# Patient Record
Sex: Female | Born: 1995 | Marital: Single | Smoking: Never smoker
Health system: Southern US, Community
[De-identification: ages and names within clinical notes are randomized; demographics above are authoritative.]

---
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
## 2014-05-26 ENCOUNTER — Emergency Department (INDEPENDENT_AMBULATORY_CARE_PROVIDER_SITE_OTHER)
Admission: EM | Admit: 2014-05-26 | Discharge: 2014-05-26 | Disposition: A | Discharge status: Home / Self Care | Payer: Medicaid Other | Source: Home / Self Care

## 2014-05-26 ENCOUNTER — Encounter (HOSPITAL_COMMUNITY): Payer: Self-pay | Admitting: Family Medicine

## 2014-05-26 DIAGNOSIS — A084 Viral intestinal infection, unspecified: Secondary | ICD-10-CM

## 2014-05-26 MED ORDER — ONDANSETRON HCL 4 MG PO TABS: 4.0000 mg | ORAL_TABLET | Freq: Three times a day (TID) | ORAL | Status: DC | PRN

## 2014-05-26 NOTE — ED Notes (Signed)
Reports two days of vomiting and diarrhea.  No fever.   States vomiting started yesterday evening and into this morning.  No able to keep down water or food.  Symptoms gradually getting worse.  No otc treatments tried.

## 2014-05-26 NOTE — ED Provider Notes (Signed)
CSN: [id number]     Arrival date & time 05/26/14  [id number] History   None    Chief Complaint  Patient presents with  . Emesis  . Diarrhea   (Consider location/radiation/quality/duration/timing/severity/associated sxs/prior Treatment) HPI  Vomiting and diarrhea. Started 2 days ago. Non-bloody non-bilious emesis. 4 bouts of watery diarrhea. Mint tea w/o benefit. Comes and goes. Associated w/ nausea and itchy eyes. No sick contacts. Denies fevers, dysuria, back pain, vaginal discharge, chest pain, shortness of breath, neck stiffness, headache, hematochezia, hematemesis   LMP 05/14/14. Not sexually active.  Herbalife wt loss pills. Started them 21 days ago.   History reviewed. No pertinent past medical history. History reviewed. No pertinent past surgical history. Family History  Problem Relation Age of Onset  . Cancer Neg Hx   . Diabetes Neg Hx   . Heart failure Neg Hx   . Hyperlipidemia Neg Hx   . Hypertension Neg Hx    History  Substance Use Topics  . Smoking status: Never Smoker   . Smokeless tobacco: Not on file  . Alcohol Use: No   OB History    No data available     Review of Systems Per HPI with all other pertinent systems negative.   Allergies  Review of patient's allergies indicates no known allergies.  Home Medications   Prior to Admission medications   Medication Sig Start Date End Date Taking? Authorizing Provider  ondansetron (ZOFRAN) 4 MG tablet Take 1 tablet (4 mg total) by mouth every 8 (eight) hours as needed for nausea or vomiting. 05/26/14   Ozella Rocksavid J Merrell, MD   BP 109/75 mmHg  Pulse 72  Temp(Src) 98.8 F (37.1 C) (Oral)  Resp 14  SpO2 98%  LMP 05/14/2014 Physical Exam Physical Exam  Constitutional: oriented to person, place, and time. appears well-developed and well-nourished. No distress.  HENT:  Head: Normocephalic and atraumatic.  Eyes: EOMI. PERRL.  Neck: Normal range of motion.  Cardiovascular: RRR, no m/r/g, 2+ distal pulses,   Pulmonary/Chest: Effort normal and breath sounds normal. No respiratory distress.  Abdominal: Soft. Bowel sounds are normal. NonTTP, no distension.  Musculoskeletal: Normal range of motion. Non ttp, no effusion.  Neurological: alert and oriented to person, place, and time.  Skin: Skin is warm. No rash noted. non diaphoretic.  Psychiatric: normal mood and affect. behavior is normal. Judgment and thought content normal.   ED Course  Procedures (including critical care time) Labs Review Labs Reviewed - No data to display  Imaging Review No results found.   MDM   1. Viral gastroenteritis    Zofran, Imodium, probiotic or live active culture yogurt, fluids, rest, work note provided, precautions given and all questions answered      Ozella Rocksavid J Merrell, MD 05/26/14 [id number]

## 2014-05-26 NOTE — Discharge Instructions (Signed)
Have a viral gut infection called gastroenteritis. Please start using the Zofran for nausea and upset stomach. Please use a daily probiotic or live active culture yogurt such as activia to help replenish the good bacteria in your stomach. Consider using Imodium if your diarrhea becomes severe. This should resolve in another 1-3 days but may last as long as 2 weeks.    Gastritis, Adult Gastritis is soreness and swelling (inflammation) of the lining of the stomach. Gastritis can develop as a sudden onset (acute) or long-term (chronic) condition. If gastritis is not treated, it can lead to stomach bleeding and ulcers. CAUSES  Gastritis occurs when the stomach lining is weak or damaged. Digestive juices from the stomach then inflame the weakened stomach lining. The stomach lining may be weak or damaged due to viral or bacterial infections. One common bacterial infection is the Helicobacter pylori infection. Gastritis can also result from excessive alcohol consumption, taking certain medicines, or having too much acid in the stomach.  SYMPTOMS  In some cases, there are no symptoms. When symptoms are present, they may include:  Pain or a burning sensation in the upper abdomen.  Nausea.  Vomiting.  An uncomfortable feeling of fullness after eating. DIAGNOSIS  Your caregiver may suspect you have gastritis based on your symptoms and a physical exam. To determine the cause of your gastritis, your caregiver may perform the following:  Blood or stool tests to check for the H pylori bacterium.  Gastroscopy. A thin, flexible tube (endoscope) is passed down the esophagus and into the stomach. The endoscope has a light and camera on the end. Your caregiver uses the endoscope to view the inside of the stomach.  Taking a tissue sample (biopsy) from the stomach to examine under a microscope. TREATMENT  Depending on the cause of your gastritis, medicines may be prescribed. If you have a bacterial infection,  such as an H pylori infection, antibiotics may be given. If your gastritis is caused by too much acid in the stomach, H2 blockers or antacids may be given. Your caregiver may recommend that you stop taking aspirin, ibuprofen, or other nonsteroidal anti-inflammatory drugs (NSAIDs). HOME CARE INSTRUCTIONS  Only take over-the-counter or prescription medicines as directed by your caregiver.  If you were given antibiotic medicines, take them as directed. Finish them even if you start to feel better.  Drink enough fluids to keep your urine clear or pale yellow.  Avoid foods and drinks that make your symptoms worse, such as:  Caffeine or alcoholic drinks.  Chocolate.  Peppermint or mint flavorings.  Garlic and onions.  Spicy foods.  Citrus fruits, such as oranges, lemons, or limes.  Tomato-based foods such as sauce, chili, salsa, and pizza.  Fried and fatty foods.  Eat small, frequent meals instead of large meals. SEEK IMMEDIATE MEDICAL CARE IF:   You have black or dark red stools.  You vomit blood or material that looks like coffee grounds.  You are unable to keep fluids down.  Your abdominal pain gets worse.  You have a fever.  You do not feel better after 1 week.  You have any other questions or concerns. MAKE SURE YOU:  Understand these instructions.  Will watch your condition.  Will get help right away if you are not doing well or get worse. Document Released: 12/18/2000 Document Revised: 06/25/2011 Document Reviewed: 02/06/2011 [facility] Patient Information 2015 [place]. This information is not intended to replace advice given to you by your health care provider. Make sure you  discuss any questions you have with your health care provider. ° °

## 2014-05-28 ENCOUNTER — Emergency Department (HOSPITAL_COMMUNITY)
Admission: EM | Admit: 2014-05-28 | Discharge: 2014-05-28 | Disposition: A | Discharge status: Home / Self Care | Payer: Medicaid Other | Attending: Emergency Medicine | Admitting: Emergency Medicine

## 2014-05-28 ENCOUNTER — Encounter (HOSPITAL_COMMUNITY): Payer: Self-pay | Admitting: Emergency Medicine

## 2014-05-28 DIAGNOSIS — R1110 Vomiting, unspecified: Secondary | ICD-10-CM | POA: Diagnosis present

## 2014-05-28 DIAGNOSIS — K529 Noninfective gastroenteritis and colitis, unspecified: Secondary | ICD-10-CM | POA: Insufficient documentation

## 2014-05-28 DIAGNOSIS — E860 Dehydration: Secondary | ICD-10-CM | POA: Diagnosis not present

## 2014-05-28 LAB — I-STAT CHEM 8, ED
BUN: 10 mg/dL (ref 6–20)
CHLORIDE: 104 mmol/L (ref 101–111)
Calcium, Ion: 1.22 mmol/L (ref 1.12–1.23)
Creatinine, Ser: 0.6 mg/dL (ref 0.44–1.00)
Glucose, Bld: 112 mg/dL — ABNORMAL HIGH (ref 65–99)
HEMATOCRIT: 43 % (ref 36.0–46.0)
Hemoglobin: 14.6 g/dL (ref 12.0–15.0)
POTASSIUM: 4 mmol/L (ref 3.5–5.1)
SODIUM: 141 mmol/L (ref 135–145)
TCO2: 24 mmol/L (ref 0–100)

## 2014-05-28 MED ORDER — ONDANSETRON HCL 4 MG/2ML IJ SOLN
4.0000 mg | Freq: Once | INTRAMUSCULAR | Status: AC
  Administered 2014-05-28: 4 mg via INTRAVENOUS
  Filled 2014-05-28: qty 2

## 2014-05-28 MED ORDER — SODIUM CHLORIDE 0.9 % IV BOLUS (SEPSIS)
1000.0000 mL | Freq: Once | INTRAVENOUS | Status: AC
  Administered 2014-05-28: 1000 mL via INTRAVENOUS

## 2014-05-28 NOTE — ED Notes (Addendum)
Pt reports continue to have vomiting and diarrhea x 5 days. Pt seen at urgent care for same. Last vomiting was yesterday and diarrhea 2 days ago.

## 2014-05-28 NOTE — ED Provider Notes (Signed)
CSN: [id number]     Arrival date & time 05/28/14  1003 History   First MD Initiated Contact with Patient 05/28/14 1013     Chief Complaint  Patient presents with  . Emesis  . Diarrhea      HPI Patient presents with gastroenteritis symptoms for the last 4-5 days.  Seen in urgent care 2 days ago and placed on Zofran.  Has had some continued vomiting but last time she vomited was yesterday.  Last diarrhea was 2 days ago.  Reason she came in today because she got dizzy.  Patient is not sexually active.  She has a previous medical history of anemia History reviewed. No pertinent past medical history. History reviewed. No pertinent past surgical history. Family History  Problem Relation Age of Onset  . Cancer Neg Hx   . Diabetes Neg Hx   . Heart failure Neg Hx   . Hyperlipidemia Neg Hx   . Hypertension Neg Hx    History  Substance Use Topics  . Smoking status: Never Smoker   . Smokeless tobacco: Not on file  . Alcohol Use: No   OB History    No data available     Review of Systems  All other systems reviewed and are negative  Allergies  Review of patient's allergies indicates no known allergies.  Home Medications   Prior to Admission medications   Medication Sig Start Date End Date Taking? Authorizing Provider  acetaminophen (TYLENOL) 500 MG tablet Take 500 mg by mouth every 6 (six) hours as needed for mild pain or moderate pain.   Yes Historical Provider, MD  ondansetron (ZOFRAN) 4 MG tablet Take 1 tablet (4 mg total) by mouth every 8 (eight) hours as needed for nausea or vomiting. 05/26/14  Yes Ozella Rocksavid J Merrell, MD   BP 102/54 mmHg  Pulse 81  Temp(Src) 98.4 F (36.9 C) (Oral)  Resp 16  Ht 5' (1.524 m)  Wt 169 lb 9 oz (76.913 kg)  BMI 33.12 kg/m2  SpO2 100%  LMP 05/14/2014 Physical Exam Physical Exam  Nursing note and vitals reviewed. Constitutional: She is oriented to person, place, and time. She appears well-developed and well-nourished. No distress.  HENT:   Head: Normocephalic and atraumatic.  Eyes: Pupils are equal, round, and reactive to light.  Neck: Normal range of motion.  Cardiovascular: Normal rate and intact distal pulses.   Pulmonary/Chest: No respiratory distress.  Abdominal: Normal appearance. She exhibits no distension.  Musculoskeletal: Normal range of motion.  Neurological: She is alert and oriented to person, place, and time. No cranial nerve deficit.  Skin: Skin is warm and dry. No rash noted.  Psychiatric: She has a normal mood and affect. Her behavior is normal.   ED Course  Procedures (including critical care time) Medications  sodium chloride 0.9 % bolus 1,000 mL (0 mLs Intravenous Stopped 05/28/14 1147)  ondansetron (ZOFRAN) injection 4 mg (4 mg Intravenous Given 05/28/14 1029)    Labs Review Labs Reviewed  I-STAT CHEM 8, ED - Abnormal; Notable for the following:    Glucose, Bld 112 (*)    All other components within normal limits   After treatment in the ED the patient feels back to baseline and wants to go home.   MDM   Final diagnoses:  Gastroenteritis  Dehydration        Nelva Nayobert Livy Ross, MD 05/28/14 1204

## 2014-05-28 NOTE — Discharge Instructions (Signed)
Dehydration, Adult Dehydration is when you lose more fluids from the body than you take in. Vital organs like the kidneys, brain, and heart cannot function without a proper amount of fluids and salt. Any loss of fluids from the body can cause dehydration.  CAUSES   Vomiting.  Diarrhea.  Excessive sweating.  Excessive urine output.  Fever. SYMPTOMS  Mild dehydration  Thirst.  Dry lips.  Slightly dry mouth. Moderate dehydration  Very dry mouth.  Sunken eyes.  Skin does not bounce back quickly when lightly pinched and released.  Dark urine and decreased urine production.  Decreased tear production.  Headache. Severe dehydration  Very dry mouth.  Extreme thirst.  Rapid, weak pulse (more than 100 beats per minute at rest).  Cold hands and feet.  Not able to sweat in spite of heat and temperature.  Rapid breathing.  Blue lips.  Confusion and lethargy.  Difficulty being awakened.  Minimal urine production.  No tears. DIAGNOSIS  Your caregiver will diagnose dehydration based on your symptoms and your exam. Blood and urine tests will help confirm the diagnosis. The diagnostic evaluation should also identify the cause of dehydration. TREATMENT  Treatment of mild or moderate dehydration can often be done at home by increasing the amount of fluids that you drink. It is best to drink small amounts of fluid more often. Drinking too much at one time can make vomiting worse. Refer to the home care instructions below. Severe dehydration needs to be treated at the hospital where you will probably be given intravenous (IV) fluids that contain water and electrolytes. HOME CARE INSTRUCTIONS   Ask your caregiver about specific rehydration instructions.  Drink enough fluids to keep your urine clear or pale yellow.  Drink small amounts frequently if you have nausea and vomiting.  Eat as you normally do.  Avoid:  Foods or drinks high in sugar.  Carbonated  drinks.  Juice.  Extremely hot or cold fluids.  Drinks with caffeine.  Fatty, greasy foods.  Alcohol.  Tobacco.  Overeating.  Gelatin desserts.  Wash your hands well to avoid spreading bacteria and viruses.  Only take over-the-counter or prescription medicines for pain, discomfort, or fever as directed by your caregiver.  Ask your caregiver if you should continue all prescribed and over-the-counter medicines.  Keep all follow-up appointments with your caregiver. SEEK MEDICAL CARE IF:  You have abdominal pain and it increases or stays in one area (localizes).  You have a rash, stiff neck, or severe headache.  You are irritable, sleepy, or difficult to awaken.  You are weak, dizzy, or extremely thirsty. SEEK IMMEDIATE MEDICAL CARE IF:   You are unable to keep fluids down or you get worse despite treatment.  You have frequent episodes of vomiting or diarrhea.  You have blood or green matter (bile) in your vomit.  You have blood in your stool or your stool looks black and tarry.  You have not urinated in 6 to 8 hours, or you have only urinated a small amount of very dark urine.  You have a fever.  You faint. MAKE SURE YOU:   Understand these instructions.  Will watch your condition.  Will get help right away if you are not doing well or get worse. Document Released: 12/24/2004 Document Revised: 03/18/2011 Document Reviewed: 08/13/2010 [employer] Patient Information 2015 [employer]. This information is not intended to replace advice given to you by your health care provider. Make sure you discuss any questions you have with your health care   provider.  

## 2014-06-14 ENCOUNTER — Ambulatory Visit
Admission: RE | Admit: 2014-06-14 | Discharge: 2014-06-14 | Disposition: A | Discharge status: Home / Self Care | Payer: Medicaid Other | Source: Ambulatory Visit | Attending: Infectious Disease | Admitting: Infectious Disease

## 2014-06-14 ENCOUNTER — Other Ambulatory Visit: Payer: Self-pay | Admitting: Infectious Disease

## 2014-06-14 DIAGNOSIS — R7611 Nonspecific reaction to tuberculin skin test without active tuberculosis: Secondary | ICD-10-CM

## 2014-07-31 ENCOUNTER — Emergency Department (HOSPITAL_COMMUNITY): Payer: Medicaid Other

## 2014-07-31 ENCOUNTER — Encounter (HOSPITAL_COMMUNITY): Payer: Self-pay | Admitting: Nurse Practitioner

## 2014-07-31 ENCOUNTER — Emergency Department (HOSPITAL_COMMUNITY)
Admission: EM | Admit: 2014-07-31 | Discharge: 2014-07-31 | Disposition: A | Discharge status: Home / Self Care | Payer: Medicaid Other | Attending: Emergency Medicine | Admitting: Emergency Medicine

## 2014-07-31 DIAGNOSIS — L03012 Cellulitis of left finger: Secondary | ICD-10-CM | POA: Diagnosis not present

## 2014-07-31 DIAGNOSIS — R42 Dizziness and giddiness: Secondary | ICD-10-CM | POA: Insufficient documentation

## 2014-07-31 DIAGNOSIS — G44209 Tension-type headache, unspecified, not intractable: Secondary | ICD-10-CM | POA: Diagnosis not present

## 2014-07-31 DIAGNOSIS — R002 Palpitations: Secondary | ICD-10-CM | POA: Diagnosis not present

## 2014-07-31 DIAGNOSIS — IMO0001 Reserved for inherently not codable concepts without codable children: Secondary | ICD-10-CM

## 2014-07-31 DIAGNOSIS — R079 Chest pain, unspecified: Secondary | ICD-10-CM | POA: Insufficient documentation

## 2014-07-31 DIAGNOSIS — R112 Nausea with vomiting, unspecified: Secondary | ICD-10-CM | POA: Insufficient documentation

## 2014-07-31 DIAGNOSIS — M79642 Pain in left hand: Secondary | ICD-10-CM | POA: Diagnosis present

## 2014-07-31 DIAGNOSIS — R0602 Shortness of breath: Secondary | ICD-10-CM | POA: Diagnosis not present

## 2014-07-31 LAB — BASIC METABOLIC PANEL
Anion gap: 9 (ref 5–15)
BUN: 10 mg/dL (ref 6–20)
CHLORIDE: 106 mmol/L (ref 101–111)
CO2: 24 mmol/L (ref 22–32)
Calcium: 9.4 mg/dL (ref 8.9–10.3)
Creatinine, Ser: 0.68 mg/dL (ref 0.44–1.00)
GFR calc non Af Amer: >60 mL/min (ref >60)
Glucose, Bld: 98 mg/dL (ref 65–99)
POTASSIUM: 4 mmol/L (ref 3.5–5.1)
Sodium: 139 mmol/L (ref 135–145)

## 2014-07-31 LAB — CBC
HCT: 38.3 % (ref 36.0–46.0)
Hemoglobin: 12.6 g/dL (ref 12.0–15.0)
MCH: 27.5 pg (ref 26.0–34.0)
MCHC: 32.9 g/dL (ref 30.0–36.0)
MCV: 83.6 fL (ref 78.0–100.0)
PLATELETS: 252 10*3/uL (ref 150–400)
RBC: 4.58 MIL/uL (ref 3.87–5.11)
RDW: 15.3 % (ref 11.5–15.5)
WBC: 9.1 10*3/uL (ref 4.0–10.5)

## 2014-07-31 LAB — I-STAT TROPONIN, ED: Troponin i, poc: 0 ng/mL (ref 0.00–0.08)

## 2014-07-31 MED ORDER — CEPHALEXIN 500 MG PO CAPS: 500.0000 mg | ORAL_CAPSULE | Freq: Two times a day (BID) | ORAL | Status: DC

## 2014-07-31 MED ORDER — IBUPROFEN 800 MG PO TABS: 800.0000 mg | ORAL_TABLET | Freq: Three times a day (TID) | ORAL | Status: DC

## 2014-07-31 MED ORDER — KETOROLAC TROMETHAMINE 60 MG/2ML IM SOLN
60.0000 mg | Freq: Once | INTRAMUSCULAR | Status: AC
  Administered 2014-07-31: 60 mg via INTRAMUSCULAR
  Filled 2014-07-31: qty 2

## 2014-07-31 MED ORDER — DEXAMETHASONE SODIUM PHOSPHATE 10 MG/ML IJ SOLN
10.0000 mg | Freq: Once | INTRAMUSCULAR | Status: AC
  Administered 2014-07-31: 10 mg via INTRAMUSCULAR
  Filled 2014-07-31: qty 1

## 2014-07-31 MED ORDER — DIPHENHYDRAMINE HCL 25 MG PO CAPS
25.0000 mg | ORAL_CAPSULE | Freq: Once | ORAL | Status: AC
  Administered 2014-07-31: 25 mg via ORAL
  Filled 2014-07-31: qty 1

## 2014-07-31 MED ORDER — METOCLOPRAMIDE HCL 10 MG PO TABS
10.0000 mg | ORAL_TABLET | Freq: Once | ORAL | Status: AC
  Administered 2014-07-31: 10 mg via ORAL
  Filled 2014-07-31: qty 1

## 2014-07-31 NOTE — ED Provider Notes (Addendum)
CSN: [id number]     Arrival date & time 07/31/14  1750 History   First MD Initiated Contact with Patient 07/31/14 1854     Chief Complaint  Patient presents with  . Headache  . Chest Pain  . Hand Pain     (Consider location/radiation/quality/duration/timing/severity/associated sxs/prior Treatment) HPI Comments: 19 year old female presenting with 3 complaints. First, reports intermittent headaches over the past 2 months that come and go at random. Headaches are bilateral around her temples, described as throbbing and pressure-like, temporarily relieved by ibuprofen, worsened by light and sound. Admits to associated nausea without vomiting along with dizziness. Dizziness is described as a lightheadedness. Denies vision change, neck pain or stiffness, fever, chills, extremity numbness, tingling or weakness, slurred speech or confusion. Also complaining of intermittent chest pain 3 weeks. Pain is substernal described as a pressure and as if her "heart is pounding". Admits to shortness of breath when the chest pain starts. Nothing in specific makes the pain come or go. Nonsmoker. Denies any recent long travel. Denies calf pain or swelling. No exogenous Estridge in. The family history of early heart disease. Lastly, the patient is complaining of pain and swelling to her left middle fingertip 1 week. No known injury or trauma. States her finger is throbbing. No alleviating factors.  Patient is a 19 y.o. female presenting with headaches, chest pain, and hand pain. The history is provided by the patient.  Headache Associated symptoms: dizziness and nausea   Chest Pain Associated symptoms: dizziness, headache, nausea, palpitations and shortness of breath   Hand Pain Associated symptoms include chest pain, headaches and nausea.    History reviewed. No pertinent past medical history. History reviewed. No pertinent past surgical history. Family History  Problem Relation Age of Onset  . Cancer Neg Hx    . Diabetes Neg Hx   . Heart failure Neg Hx   . Hyperlipidemia Neg Hx   . Hypertension Neg Hx    History  Substance Use Topics  . Smoking status: Never Smoker   . Smokeless tobacco: Not on file  . Alcohol Use: No   OB History    No data available     Review of Systems  Respiratory: Positive for shortness of breath.   Cardiovascular: Positive for chest pain and palpitations.  Gastrointestinal: Positive for nausea.  Musculoskeletal:       + L middle finger pain and swelling.  Skin: Positive for color change.  Neurological: Positive for dizziness and headaches.  All other systems reviewed and are negative.     Allergies  Review of patient's allergies indicates no known allergies.  Home Medications   Prior to Admission medications   Medication Sig Start Date End Date Taking? Authorizing Provider  acetaminophen (TYLENOL) 500 MG tablet Take 500 mg by mouth every 6 (six) hours as needed for mild pain or moderate pain.   Yes Historical Provider, MD  rizatriptan (MAXALT) 10 MG tablet Take 10 mg by mouth daily as needed. 07/06/14  Yes Historical Provider, MD  cephALEXin (KEFLEX) 500 MG capsule Take 1 capsule (500 mg total) by mouth 2 (two) times daily. 07/31/14   Labrittany Wechter M Burl Tauzin, PA-C  ibuprofen (ADVIL,MOTRIN) 800 MG tablet Take 1 tablet (800 mg total) by mouth 3 (three) times daily. 07/31/14   Alahia Whicker M Syair Fricker, PA-C   BP 114/66 mmHg  Pulse 78  Temp(Src) 98.5 F (36.9 C) (Oral)  Resp 18  Ht 5' (1.524 m)  Wt 155 lb (70.308 kg)  BMI 30.27 kg/m2  SpO2 100%  LMP 07/26/2014 Physical Exam  Constitutional: She is oriented to person, place, and time. She appears well-developed and well-nourished. No distress.  HENT:  Head: Normocephalic and atraumatic.  Mouth/Throat: Oropharynx is clear and moist.  Eyes: Conjunctivae and EOM are normal. Pupils are equal, round, and reactive to light.  Neck: Normal range of motion. Neck supple. No JVD present. No Brudzinski's sign and no Kernig's sign  noted.  Cardiovascular: Normal rate, regular rhythm, normal heart sounds and intact distal pulses.   No extremity edema.  Pulmonary/Chest: Effort normal and breath sounds normal. No respiratory distress.  Abdominal: Soft. Bowel sounds are normal. There is no tenderness.  Musculoskeletal: Normal range of motion.  Area of fluctuance along lateral aspect of left middle fingernail and along cuticle with swelling of distal tip, erythematous and warm. Does not cross joint line. No lymphatic streaking. Full flexion and extension of MCP, PIP and DIP without pain.  Neurological: She is alert and oriented to person, place, and time. She has normal strength. No sensory deficit. She displays a negative Romberg sign. Coordination and gait normal. GCS eye subscore is 4. GCS verbal subscore is 5. GCS motor subscore is 6.  Speech fluent, goal oriented. Moves extremities without ataxia. Equal grip strength bilateral.  Skin: Skin is warm and dry. She is not diaphoretic.  Psychiatric: She has a normal mood and affect. Her behavior is normal.  Nursing note and vitals reviewed.   ED Course  Procedures (including critical care time) INCISION AND DRAINAGE Performed by: Celene Skeen Consent: Verbal consent obtained. Risks and benefits: risks, benefits and alternatives were discussed Type: abscess  Body area: L middle finger  Incision made with 11 blade scalpel.  Anesthesia: none  Complexity: simple  Drainage: purulent  Drainage amount: medium  Packing material: none  Patient tolerance: Patient tolerated the procedure well with no immediate complications.    Labs Review Labs Reviewed  BASIC METABOLIC PANEL  CBC  I-STAT TROPOININ, ED    Imaging Review Dg Chest 2 View  07/31/2014   CLINICAL DATA:  Chest pain, 2-3 weeks duration.  EXAM: CHEST  2 VIEW  COMPARISON:  06/14/2014  FINDINGS: Heart size is normal. Mediastinal shadows are normal. The lungs are clear. No effusions. No bony abnormalities.   IMPRESSION: Normal chest   Electronically Signed   By: Paulina Fusi M.D.   On: 07/31/2014 20:17     EKG Interpretation None      MDM   Final diagnoses:  Paronychia of third finger, left  Tension-type headache, not intractable, unspecified chronicity pattern   Nontoxic appearing, NAD. AF VSS. Regarding headache, no red flags concerning this headache. It has been intermittent for a month and sounds tension like. No focal neurologic deficits. No meningeal signs. Doubt SAH, ICH, meningitis or CVA. Headache improved after receiving Toradol, Reglan, Decadron and Benadryl. Regarding chest pain, workup negative. Doubt cardiac. Heart score 0. PERC negative. Doubt PE. Paronychia drained, she does have swelling, erythema and warmth to the distal phalanx without lymphatic streaking or crossing joint line. Will start patient on Keflex and advised warm soaks. Follow-up with PCP next week. Stable for discharge. Return precautions given. Patient states understanding of treatment care plan and is agreeable.  Kathrynn Speed, PA-C 07/31/14 2130  Tilden Fossa, MD 07/31/14 2148  Kathrynn Speed, PA-C 09/02/14 1650  Tilden Fossa, MD 09/02/14 [phone]

## 2014-07-31 NOTE — Discharge Instructions (Signed)
Take antibiotic to completion. Take ibuprofen as directed as needed for headache. Follow up with your primary care doctor in 1 week.  Paronychia Paronychia is an inflammatory reaction involving the folds of the skin surrounding the fingernail. This is commonly caused by an infection in the skin around a nail. The most common cause of paronychia is frequent wetting of the hands (as seen with bartenders, food servers, nurses or others who wet their hands). This makes the skin around the fingernail susceptible to infection by bacteria (germs) or fungus. Other predisposing factors are:  Aggressive manicuring.  Nail biting.  Thumb sucking. The most common cause is a staphylococcal (a type of germ) infection, or a fungal (Candida) infection. When caused by a germ, it usually comes on suddenly with redness, swelling, pus and is often painful. It may get under the nail and form an abscess (collection of pus), or form an abscess around the nail. If the nail itself is infected with a fungus, the treatment is usually prolonged and may require oral medicine for up to one year. Your caregiver will determine the length of time treatment is required. The paronychia caused by bacteria (germs) may largely be avoided by not pulling on hangnails or picking at cuticles. When the infection occurs at the tips of the finger it is called felon. When the cause of paronychia is from the herpes simplex virus (HSV) it is called herpetic whitlow. TREATMENT  When an abscess is present treatment is often incision and drainage. This means that the abscess must be cut open so the pus can get out. When this is done, the following home care instructions should be followed. HOME CARE INSTRUCTIONS   It is important to keep the affected fingers very dry. Rubber or plastic gloves over cotton gloves should be used whenever the hand must be placed in water.  Keep wound clean, dry and dressed as suggested by your caregiver between warm  soaks or warm compresses.  Soak in warm water for fifteen to twenty minutes three to four times per day for bacterial infections. Fungal infections are very difficult to treat, so often require treatment for long periods of time.  For bacterial (germ) infections take antibiotics (medicine which kill germs) as directed and finish the prescription, even if the problem appears to be solved before the medicine is gone.  Only take over-the-counter or prescription medicines for pain, discomfort, or fever as directed by your caregiver. SEEK IMMEDIATE MEDICAL CARE IF:  You have redness, swelling, or increasing pain in the wound.  You notice pus coming from the wound.  You have a fever.  You notice a bad smell coming from the wound or dressing. Document Released: 06/19/2000 Document Revised: 03/18/2011 Document Reviewed: 02/19/2008 [facility] Patient Information 2015 [place], [place]. This information is not intended to replace advice given to you by your health care provider. Make sure you discuss any questions you have with your health care provider.  Tension Headache A tension headache is a feeling of pain, pressure, or aching often felt over the front and sides of the head. The pain can be dull or can feel tight (constricting). It is the most common type of headache. Tension headaches are not normally associated with nausea or vomiting and do not get worse with physical activity. Tension headaches can last 30 minutes to several days.  CAUSES  The exact cause is not known, but it may be caused by chemicals and hormones in the brain that lead to pain. Tension headaches often  begin after stress, anxiety, or depression. Other triggers may include:  Alcohol.  Caffeine (too much or withdrawal).  Respiratory infections (colds, flu, sinus infections).  Dental problems or teeth clenching.  Fatigue.  Holding your head and neck in one position too long while using a computer. SYMPTOMS   Pressure  around the head.   Dull, aching head pain.   Pain felt over the front and sides of the head.   Tenderness in the muscles of the head, neck, and shoulders. DIAGNOSIS  A tension headache is often diagnosed based on:   Symptoms.   Physical examination.   A CT scan or MRI of your head. These tests may be ordered if symptoms are severe or unusual. TREATMENT  Medicines may be given to help relieve symptoms.  HOME CARE INSTRUCTIONS   Only take over-the-counter or prescription medicines for pain or discomfort as directed by your caregiver.   Lie down in a dark, quiet room when you have a headache.   Keep a journal to find out what may be triggering your headaches. For example, write down:  What you eat and drink.  How much sleep you get.  Any change to your diet or medicines.  Try massage or other relaxation techniques.   Ice packs or heat applied to the head and neck can be used. Use these 3 to 4 times per day for 15 to 20 minutes each time, or as needed.   Limit stress.   Sit up straight, and do not tense your muscles.   Quit smoking if you smoke.  Limit alcohol use.  Decrease the amount of caffeine you drink, or stop drinking caffeine.  Eat and exercise regularly.  Get 7 to 9 hours of sleep, or as recommended by your caregiver.  Avoid excessive use of pain medicine as recurrent headaches can occur.  SEEK MEDICAL CARE IF:   You have problems with the medicines you were prescribed.  Your medicines do not work.  You have a change from the usual headache.  You have nausea or vomiting. SEEK IMMEDIATE MEDICAL CARE IF:   Your headache becomes severe.  You have a fever.  You have a stiff neck.  You have loss of vision.  You have muscular weakness or loss of muscle control.  You lose your balance or have trouble walking.  You feel faint or pass out.  You have severe symptoms that are different from your first symptoms. MAKE SURE YOU:    Understand these instructions.  Will watch your condition.  Will get help right away if you are not doing well or get worse. Document Released: 12/24/2004 Document Revised: 03/18/2011 Document Reviewed: 12/14/2010 [facility] Patient Information 2015 [place], [place]. This information is not intended to replace advice given to you by your health care provider. Make sure you discuss any questions you have with your health care provider.

## 2014-07-31 NOTE — ED Notes (Signed)
She c/o intermittent headache and dizziness x 2 months, increased with noise and light, decreased by nothing. She also has had intermittent CP and "feels like my heart is pounding" x 3 weeks. She also c/o L middle fingertip pain and swelling over past few days, redness and pus noted beside nail bed. A&Ox4, resp e/u

## 2014-09-24 ENCOUNTER — Emergency Department (HOSPITAL_COMMUNITY)
Admission: EM | Admit: 2014-09-24 | Discharge: 2014-09-24 | Disposition: A | Discharge status: Home / Self Care | Payer: Medicaid Other | Attending: Emergency Medicine | Admitting: Emergency Medicine

## 2014-09-24 ENCOUNTER — Emergency Department (HOSPITAL_COMMUNITY): Admission: EM | Admit: 2014-09-24 | Discharge: 2014-09-24 | Discharge status: Absent without leave | Payer: Self-pay

## 2014-09-24 ENCOUNTER — Encounter (HOSPITAL_COMMUNITY): Payer: Self-pay | Admitting: Nurse Practitioner

## 2014-09-24 DIAGNOSIS — R42 Dizziness and giddiness: Secondary | ICD-10-CM

## 2014-09-24 DIAGNOSIS — Z3202 Encounter for pregnancy test, result negative: Secondary | ICD-10-CM | POA: Diagnosis not present

## 2014-09-24 DIAGNOSIS — R112 Nausea with vomiting, unspecified: Secondary | ICD-10-CM | POA: Diagnosis not present

## 2014-09-24 DIAGNOSIS — L03012 Cellulitis of left finger: Secondary | ICD-10-CM

## 2014-09-24 DIAGNOSIS — Z791 Long term (current) use of non-steroidal anti-inflammatories (NSAID): Secondary | ICD-10-CM | POA: Insufficient documentation

## 2014-09-24 DIAGNOSIS — Z792 Long term (current) use of antibiotics: Secondary | ICD-10-CM | POA: Insufficient documentation

## 2014-09-24 LAB — I-STAT BETA HCG BLOOD, ED (MC, WL, AP ONLY)

## 2014-09-24 LAB — CBC
HEMATOCRIT: 38.4 % (ref 36.0–46.0)
HEMOGLOBIN: 12.5 g/dL (ref 12.0–15.0)
MCH: 26.9 pg (ref 26.0–34.0)
MCHC: 32.6 g/dL (ref 30.0–36.0)
MCV: 82.8 fL (ref 78.0–100.0)
Platelets: 270 10*3/uL (ref 150–400)
RBC: 4.64 MIL/uL (ref 3.87–5.11)
RDW: 15.1 % (ref 11.5–15.5)
WBC: 6.5 10*3/uL (ref 4.0–10.5)

## 2014-09-24 LAB — BASIC METABOLIC PANEL
ANION GAP: 7 (ref 5–15)
BUN: 10 mg/dL (ref 6–20)
CHLORIDE: 106 mmol/L (ref 101–111)
CO2: 25 mmol/L (ref 22–32)
Calcium: 9.3 mg/dL (ref 8.9–10.3)
Creatinine, Ser: 0.58 mg/dL (ref 0.44–1.00)
Glucose, Bld: 83 mg/dL (ref 65–99)
POTASSIUM: 4 mmol/L (ref 3.5–5.1)
SODIUM: 138 mmol/L (ref 135–145)

## 2014-09-24 LAB — URINALYSIS, ROUTINE W REFLEX MICROSCOPIC
BILIRUBIN URINE: NEGATIVE
Glucose, UA: NEGATIVE mg/dL
Hgb urine dipstick: NEGATIVE
Ketones, ur: NEGATIVE mg/dL
LEUKOCYTES UA: NEGATIVE
NITRITE: NEGATIVE
Protein, ur: NEGATIVE mg/dL
SPECIFIC GRAVITY, URINE: 1.005 (ref 1.005–1.030)
UROBILINOGEN UA: 0.2 mg/dL (ref 0.0–1.0)
pH: 7 (ref 5.0–8.0)

## 2014-09-24 LAB — MONONUCLEOSIS SCREEN: Mono Screen: NEGATIVE

## 2014-09-24 LAB — RAPID STREP SCREEN (MED CTR MEBANE ONLY): Streptococcus, Group A Screen (Direct): NEGATIVE

## 2014-09-24 MED ORDER — SODIUM CHLORIDE 0.9 % IV BOLUS (SEPSIS)
1000.0000 mL | Freq: Once | INTRAVENOUS | Status: AC
  Administered 2014-09-24: 1000 mL via INTRAVENOUS

## 2014-09-24 MED ORDER — PROMETHAZINE HCL 25 MG/ML IJ SOLN
25.0000 mg | Freq: Once | INTRAMUSCULAR | Status: AC
  Administered 2014-09-24: 25 mg via INTRAVENOUS
  Filled 2014-09-24: qty 1

## 2014-09-24 MED ORDER — SULFAMETHOXAZOLE-TRIMETHOPRIM 800-160 MG PO TABS: 1.0000 | ORAL_TABLET | Freq: Two times a day (BID) | ORAL | Status: AC

## 2014-09-24 MED ORDER — LIDOCAINE-EPINEPHRINE (PF) 2 %-1:200000 IJ SOLN: 10.0000 mL | Freq: Once | INTRAMUSCULAR | Status: DC

## 2014-09-24 MED ORDER — LIDOCAINE HCL (PF) 1 % IJ SOLN
5.0000 mL | Freq: Once | INTRAMUSCULAR | Status: DC
  Filled 2014-09-24: qty 5

## 2014-09-24 NOTE — ED Provider Notes (Signed)
Present CSN: [id number]     Arrival date & time 09/24/14  1500 History   First MD Initiated Contact with Patient 09/24/14 1729     Chief Complaint  Patient presents with  . Dizziness     (Consider location/radiation/quality/duration/timing/severity/associated sxs/prior Treatment) HPI  The patient is a 19 year old female, she has no significant past medical history other than some headaches, she presents with approximately 5 days of a sore throat, feeling lightheaded when she stands for too long and is now developed a recurrent paronychia of the left third finger. The symptoms are persistent, worse with standing, not associated with fevers chills or diarrhea though she does have recurrent nausea and vomiting especially with eating. She has no difficulty with speech or breathing and has no chest pain coughing or shortness of breath. She denies fevers or chills.  History reviewed. No pertinent past medical history. History reviewed. No pertinent past surgical history. Family History  Problem Relation Age of Onset  . Cancer Neg Hx   . Diabetes Neg Hx   . Heart failure Neg Hx   . Hyperlipidemia Neg Hx   . Hypertension Neg Hx    Social History  Substance Use Topics  . Smoking status: Never Smoker   . Smokeless tobacco: None  . Alcohol Use: No   OB History    No data available     Review of Systems  All other systems reviewed and are negative.     Allergies  Review of patient's allergies indicates no known allergies.  Home Medications   Prior to Admission medications   Medication Sig Start Date End Date Taking? Authorizing Provider  acetaminophen (TYLENOL) 500 MG tablet Take 500 mg by mouth every 6 (six) hours as needed for mild pain or moderate pain.    Historical Provider, MD  cephALEXin (KEFLEX) 500 MG capsule Take 1 capsule (500 mg total) by mouth 2 (two) times daily. 07/31/14   Robyn M Hess, PA-C  ibuprofen (ADVIL,MOTRIN) 800 MG tablet Take 1 tablet (800 mg total) by  mouth 3 (three) times daily. 07/31/14   Kathrynn Speed, PA-C  rizatriptan (MAXALT) 10 MG tablet Take 10 mg by mouth daily as needed. 07/06/14   Historical Provider, MD  sulfamethoxazole-trimethoprim (BACTRIM DS,SEPTRA DS) 800-160 MG per tablet Take 1 tablet by mouth 2 (two) times daily. 09/24/14 10/01/14  Eber Hong, MD   BP 101/53 mmHg  Pulse 82  Temp(Src) 98.6 F (37 C) (Oral)  Resp 25  Ht 5' (1.524 m)  Wt 173 lb (78.472 kg)  BMI 33.79 kg/m2  SpO2 100%  LMP 09/03/2014 Physical Exam  Constitutional: She appears well-developed and well-nourished. No distress.  HENT:  Head: Normocephalic and atraumatic.  Mouth/Throat: Oropharynx is clear and moist. No oropharyngeal exudate.  Normal appearing posterior pharynx, tympanic membranes, nasal passages, no exudate asymmetry hypertrophy or erythema of the throat, moist mucous membranes  Eyes: Conjunctivae and EOM are normal. Pupils are equal, round, and reactive to light. Right eye exhibits no discharge. Left eye exhibits no discharge. No scleral icterus.  Neck: Normal range of motion. Neck supple. No JVD present. No thyromegaly present.  No lymphadenopathy or stiffness of the neck  Cardiovascular: Normal rate, regular rhythm, normal heart sounds and intact distal pulses.  Exam reveals no gallop and no friction rub.   No murmur heard. Normal pulses, no tachycardia, no murmur  Pulmonary/Chest: Effort normal and breath sounds normal. No respiratory distress. She has no wheezes. She has no rales.  Lungs clear, no wheezing  Abdominal: Soft. Bowel sounds are normal. She exhibits no distension and no mass. There is no tenderness.  No abdominal tenderness to palpation  Musculoskeletal: Normal range of motion. She exhibits tenderness ( Mild tenderness to palpation of the left third finger distally, paronychia present, small). She exhibits no edema.  Lymphadenopathy:    She has no cervical adenopathy.  Neurological: She is alert. Coordination normal.   Skin: Skin is warm and dry. No rash noted. No erythema.  Psychiatric: She has a normal mood and affect. Her behavior is normal.  Nursing note and vitals reviewed.   ED Course  Procedures (including critical care time) Labs Review Labs Reviewed  URINALYSIS, ROUTINE W REFLEX MICROSCOPIC (NOT AT [facility]) - Abnormal; Notable for the following:    Color, Urine STRAW (*)    All other components within normal limits  RAPID STREP SCREEN (NOT AT [facility])  CULTURE, GROUP A STREP  BASIC METABOLIC PANEL  CBC  MONONUCLEOSIS SCREEN  I-STAT BETA HCG BLOOD, ED ([facility], [facility], [facility] ONLY)    Imaging Review No results found. I have personally reviewed and evaluated these images and lab results as part of my medical decision-making.   EKG Interpretation   Date/Time:  Saturday September 24 2014 15:13:20 EDT Ventricular Rate:  79 PR Interval:  128 QRS Duration: 74 QT Interval:  360 QTC Calculation: 412 R Axis:   80 Text Interpretation:  Normal sinus rhythm Normal ECG since last tracing no  significant change Confirmed by MILLER  MD, BRIAN ([id number]) on 09/24/2014  5:27:57 PM      MDM   Final diagnoses:  Light headed  Paronychia of finger of left hand    Vital signs unremarkable, physical exam is rather unremarkable, will give fluids, check for mono, conservative management of paronychia with warm compresses and antibiotics will be sufficient. It is very very small, there is no fluctuant abscess there.  Meds given in ED:  Medications  lidocaine (PF) (XYLOCAINE) 1 % injection 5 mL (not administered)  sodium chloride 0.9 % bolus 1,000 mL (0 mLs Intravenous Stopped 09/24/14 1823)  promethazine (PHENERGAN) injection 25 mg (25 mg Intravenous Given 09/24/14 1811)  sodium chloride 0.9 % bolus 1,000 mL (0 mLs Intravenous Stopped 09/24/14 2014)    New Prescriptions   SULFAMETHOXAZOLE-TRIMETHOPRIM (BACTRIM DS,SEPTRA DS) 800-160 MG PER TABLET    Take 1 tablet by mouth 2 (two) times daily.       Eber Hong, MD 09/24/14 2015

## 2014-09-24 NOTE — ED Notes (Signed)
History of iron deficiency anemia, reports this feels similar to when first diagnosed with anemia. Not currently taking iron or any other meds.

## 2014-09-24 NOTE — ED Notes (Signed)
She c/o feeling dizzy with standing since yesterday, dizziness increases the longer she is on her feet. She states after about 30 minutes standing she feels she will pass out if she does not sit down. shes also had a sore throat this week. She reports chills, sweats and fatigue this week. A&ox4, resp e/u

## 2014-09-24 NOTE — ED Notes (Signed)
PT experienced dizziness when ambulating to bathroom.  Stated blurred vision initially, but returned to normal vision several minutes later.

## 2014-09-24 NOTE — Discharge Instructions (Signed)

## 2014-09-28 LAB — CULTURE, GROUP A STREP

## 2015-11-08 ENCOUNTER — Encounter (HOSPITAL_COMMUNITY): Payer: Self-pay

## 2015-11-08 ENCOUNTER — Emergency Department (HOSPITAL_COMMUNITY): Payer: Medicaid Other

## 2015-11-08 ENCOUNTER — Emergency Department (HOSPITAL_COMMUNITY)
Admission: EM | Admit: 2015-11-08 | Discharge: 2015-11-08 | Disposition: A | Discharge status: Home / Self Care | Payer: Medicaid Other | Attending: Emergency Medicine | Admitting: Emergency Medicine

## 2015-11-08 DIAGNOSIS — M25571 Pain in right ankle and joints of right foot: Secondary | ICD-10-CM | POA: Insufficient documentation

## 2015-11-08 DIAGNOSIS — X509XXA Other and unspecified overexertion or strenuous movements or postures, initial encounter: Secondary | ICD-10-CM | POA: Insufficient documentation

## 2015-11-08 DIAGNOSIS — Y9339 Activity, other involving climbing, rappelling and jumping off: Secondary | ICD-10-CM | POA: Insufficient documentation

## 2015-11-08 DIAGNOSIS — Y929 Unspecified place or not applicable: Secondary | ICD-10-CM | POA: Insufficient documentation

## 2015-11-08 DIAGNOSIS — Y999 Unspecified external cause status: Secondary | ICD-10-CM | POA: Insufficient documentation

## 2015-11-08 NOTE — ED Provider Notes (Signed)
[facility]-EMERGENCY DEPT Provider Note   CSN: [id number] Arrival date & time: 11/08/15  1022  By signing my name below, I, [name], attest that this documentation has been prepared under the direction and in the presence of non-physician practitioner, Teressa LowerVrinda Adenike Shidler, NP. Electronically Signed: [name], Scribe. 11/08/2015. 11:03 AM.  History   Chief Complaint Chief Complaint  Patient presents with  . Ankle Pain   The history is provided by the patient. No language interpreter was used.   HPI Comments: Bard HerbertFaiza Salinas is a 20 y.o. female who presents to the Emergency Department complaining of gradually worsening, right ankle pain s/p an injury that occurred at ~6:00 PM yesterday evening. Pt reports she was "jumping down the stairs" yesterday when she suddenly "twisted" her right ankle. She states her pain is exacerbated with palpation and when ambulating. She notes associated swelling.   History reviewed. No pertinent past medical history.  There are no active problems to display for this patient.  History reviewed. No pertinent surgical history.  OB History    No data available     Home Medications    Prior to Admission medications   Medication Sig Start Date End Date Taking? Authorizing Provider  acetaminophen (TYLENOL) 500 MG tablet Take 500 mg by mouth every 6 (six) hours as needed for mild pain or moderate pain.    Historical Provider, MD  cephALEXin (KEFLEX) 500 MG capsule Take 1 capsule (500 mg total) by mouth 2 (two) times daily. 07/31/14   Robyn M Hess, PA-C  ibuprofen (ADVIL,MOTRIN) 800 MG tablet Take 1 tablet (800 mg total) by mouth 3 (three) times daily. 07/31/14   Kathrynn Speedobyn M Hess, PA-C  rizatriptan (MAXALT) 10 MG tablet Take 10 mg by mouth daily as needed. 07/06/14   Historical Provider, MD    Family History Family History  Problem Relation Age of Onset  . Cancer Neg Hx   . Diabetes Neg Hx   . Heart failure Neg Hx   . Hyperlipidemia Neg Hx   . Hypertension Neg Hx      Social History Social History  Substance Use Topics  . Smoking status: Never Smoker  . Smokeless tobacco: Not on file  . Alcohol use No     Allergies   Morphine and related   Review of Systems Review of Systems  Constitutional: Negative for fever.  Musculoskeletal: Positive for arthralgias.  All other systems reviewed and are negative.  Physical Exam Updated Vital Signs BP 111/74 (BP Location: Right Arm)   Pulse 95   Temp 98.5 F (36.9 C) (Oral)   Resp 18   SpO2 100%   Physical Exam  Constitutional: She is oriented to person, place, and time. She appears well-developed and well-nourished.  HENT:  Head: Normocephalic.  Eyes: EOM are normal.  Neck: Normal range of motion.  Pulmonary/Chest: Effort normal.  Abdominal: She exhibits no distension.  Musculoskeletal: Normal range of motion.  Obvious swelling noted to the right lateral ankle. Pulses intact  Neurological: She is alert and oriented to person, place, and time.  Psychiatric: She has a normal mood and affect.  Nursing note and vitals reviewed.  ED Treatments / Results  Labs (all labs ordered are listed, but only abnormal results are displayed) Labs Reviewed - No data to display  EKG  EKG Interpretation None       Radiology No results found.  Procedures Procedures (including critical care time)  Medications Ordered in ED Medications - No data to display  DIAGNOSTIC STUDIES:  Oxygen Saturation is 100% on RA, normal by my interpretation.    COORDINATION OF CARE:  11:03 AM Discussed treatment plan with pt at bedside and pt agreed to plan.  Initial Impression / Assessment and Plan / ED Course  I have reviewed the triage vital signs and the nursing notes.  Pertinent labs & imaging results that were available during my care of the patient were reviewed by me and considered in my medical decision making (see chart for details).  Clinical Course    No acute bony injury noted. Likely  sprain. Pt placed in aso and discussed supportive care at home  I personally performed the services described in this documentation, which was scribed in my presence. The recorded information has been reviewed and is accurate.   Final Clinical Impressions(s) / ED Diagnoses   Final diagnoses:  None    New Prescriptions New Prescriptions   No medications on file     Teressa LowerVrinda Lynnann Knudsen, NP 11/08/15 1121    Arby BarretteMarcy Pfeiffer, MD 11/20/15 1545

## 2015-11-08 NOTE — ED Triage Notes (Signed)
Patient here with right ankle pain after twisting same yesterday. Pain with ambulation, no obvious deformity

## 2015-11-08 NOTE — Discharge Instructions (Signed)
Take motrin as discussed

## 2015-11-08 NOTE — Progress Notes (Signed)
Orthopedic Tech Progress Note Patient Details:  Tiffany HerbertFaiza Salinas 09/29/1995 [id number]  Ortho Devices Type of Ortho Device: ASO Ortho Device/Splint Location: rle Ortho Device/Splint Interventions: Application   Treyveon Mochizuki 11/08/2015, 11:33 AM

## 2015-11-08 NOTE — ED Notes (Signed)
Pt is in stable condition upon d/c and is escorted from ED via wheelchair. 

## 2016-01-15 ENCOUNTER — Encounter (HOSPITAL_COMMUNITY): Payer: Self-pay | Admitting: Emergency Medicine

## 2016-01-15 ENCOUNTER — Ambulatory Visit (HOSPITAL_COMMUNITY)
Admission: EM | Admit: 2016-01-15 | Discharge: 2016-01-15 | Disposition: A | Discharge status: Home / Self Care | Payer: BLUE CROSS/BLUE SHIELD | Attending: Emergency Medicine | Admitting: Emergency Medicine

## 2016-01-15 DIAGNOSIS — R05 Cough: Secondary | ICD-10-CM | POA: Insufficient documentation

## 2016-01-15 DIAGNOSIS — Z79899 Other long term (current) drug therapy: Secondary | ICD-10-CM | POA: Diagnosis not present

## 2016-01-15 DIAGNOSIS — B9789 Other viral agents as the cause of diseases classified elsewhere: Secondary | ICD-10-CM

## 2016-01-15 DIAGNOSIS — J069 Acute upper respiratory infection, unspecified: Secondary | ICD-10-CM

## 2016-01-15 DIAGNOSIS — J029 Acute pharyngitis, unspecified: Secondary | ICD-10-CM | POA: Diagnosis not present

## 2016-01-15 LAB — POCT RAPID STREP A: Streptococcus, Group A Screen (Direct): NEGATIVE

## 2016-01-15 MED ORDER — BENZONATATE 100 MG PO CAPS: 100.0000 mg | ORAL_CAPSULE | Freq: Three times a day (TID) | ORAL | 0 refills | Status: DC

## 2016-01-15 MED ORDER — IPRATROPIUM BROMIDE 0.06 % NA SOLN: 2.0000 | Freq: Four times a day (QID) | NASAL | 12 refills | Status: DC

## 2016-01-15 NOTE — ED Triage Notes (Signed)
The patient presented to the [facility] with a complaint of a sore throat x 2 days. The patient denied any fever at home.  

## 2016-01-15 NOTE — Discharge Instructions (Signed)
You may continue to take Tylenol over the counter for every four hours as needed for fever. You may also use chloraseptic throat spray or lozenges for sore throat. You have been given a medicine called Tessalon for cough and Ipratropium nasal spray for  congestion. Should your symptoms worsen or fail to improve follow up with your primary care provider or return to clinic.

## 2016-01-15 NOTE — ED Provider Notes (Signed)
CSN: [id number]     Arrival date & time 01/15/16  1007 History   First MD Initiated Contact with Patient 01/15/16 1044     Chief Complaint  Patient presents with  . Sore Throat   (Consider location/radiation/quality/duration/timing/severity/associated sxs/prior Treatment) 21 year old female presents to clinic with 2 day history of sore throat, cough and fever. States throat is significantly painful, hurting to swallow, eat, or drink. 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 Her cough is described as dry, hacking, no sputum production. Her fevers have been as high as 101 at home, she has been medicating with OTC tylenol and a combined cold/flu syrup with most recent dose this morning prior to coming to clinic.   The history is provided by the patient.  Sore Throat  Pertinent negatives include no shortness of breath.    History reviewed. No pertinent past medical history. History reviewed. No pertinent surgical history. Family History  Problem Relation Age of Onset  . Cancer Neg Hx   . Diabetes Neg Hx   . Heart failure Neg Hx   . Hyperlipidemia Neg Hx   . Hypertension Neg Hx    Social History  Substance Use Topics  . Smoking status: Never Smoker  . Smokeless tobacco: Not on file  . Alcohol use No   OB History    No data available     Review of Systems  Constitutional: Positive for chills and fever.  HENT: Positive for congestion, rhinorrhea and sore throat. Negative for ear discharge, ear pain, sinus pain and sinus pressure.   Eyes: Negative.   Respiratory: Positive for cough. Negative for shortness of breath and wheezing.   Gastrointestinal: Negative.   Genitourinary: Negative.   Musculoskeletal: Negative.     Allergies  Morphine and related  Home Medications   Prior to Admission medications   Medication Sig Start Date End Date Taking? Authorizing Provider  ibuprofen (ADVIL,MOTRIN) 800 MG  tablet Take 1 tablet (800 mg total) by mouth 3 (three) times daily. 07/31/14  Yes Robyn M Hess, PA-C  benzonatate (TESSALON) 100 MG capsule Take 1 capsule (100 mg total) by mouth every 8 (eight) hours. 01/15/16   Dorena Bodo, NP  ipratropium (ATROVENT) 0.06 % nasal spray Place 2 sprays into both nostrils 4 (four) times daily. 01/15/16   Dorena Bodo, NP   Meds Ordered and Administered this Visit  Medications - No data to display  BP 120/77 (BP Location: Right Arm)   Pulse 104   Temp 99.2 F (37.3 C) (Oral)   Resp 16   LMP 12/13/2015 (Exact Date)   SpO2 99%  No data found.   Physical Exam  Constitutional: She is oriented to person, place, and time. She appears well-developed and well-nourished. No distress.  HENT:  Head: Normocephalic.  Right Ear: Tympanic membrane and external ear normal.  Left Ear: Tympanic membrane and external ear normal.  Nose: Rhinorrhea present. Right sinus exhibits no maxillary sinus tenderness and no frontal sinus tenderness. Left sinus exhibits no maxillary sinus tenderness and no frontal sinus tenderness.  Mouth/Throat: No oropharyngeal exudate, posterior oropharyngeal edema or posterior oropharyngeal erythema. Tonsils are 1+ on the right. Tonsils are 2+ on the left. No tonsillar exudate.  Eyes: Pupils are equal, round, and reactive to light.  Neck: Normal range of motion. Neck supple. No JVD present.  Cardiovascular: Normal rate, regular rhythm and normal heart sounds.   Pulmonary/Chest: Effort normal and breath sounds  normal. No respiratory distress. She has no wheezes. She exhibits no tenderness.  Abdominal: Soft. Bowel sounds are normal.  Lymphadenopathy:       Head (right side): Submandibular and tonsillar adenopathy present.       Head (left side): Submandibular and tonsillar adenopathy present.    She has cervical adenopathy.  Neurological: She is oriented to person, place, and time.  Skin: Skin is warm and dry. Capillary refill takes less than  2 seconds. She is not diaphoretic.  Psychiatric: She has a normal mood and affect.  Nursing note and vitals reviewed.   Urgent Care Course   Clinical Course     Procedures (including critical care time)  Labs Review Labs Reviewed  POCT RAPID STREP A    Imaging Review No results found.   Visual Acuity Review  Right Eye Distance:   Left Eye Distance:   Bilateral Distance:    Right Eye Near:   Left Eye Near:    Bilateral Near:         MDM   1. Viral URI with cough    Tested for strep in clinic, test negative. Patient given Tessalon for cough and ipratropium for congestion. She may use OTC tylenol for fever and Chloraseptic spray or lozenges for sore throat. Should symptoms worsen or fail to improve follow up with PCP or return to clinic.     Dorena BodoLawrence Jaylon Grode, NP 01/15/16 1345

## 2016-01-17 LAB — CULTURE, GROUP A STREP (THRC)

## 2016-02-13 ENCOUNTER — Ambulatory Visit: Payer: BLUE CROSS/BLUE SHIELD | Admitting: Family Medicine

## 2016-02-13 NOTE — Progress Notes (Deleted)
   Subjective:   Patient ID: Tiffany Salinas    DOB: 02/09/1995, 21 y.o. female   MRN: [id number]  CC: establish care with PCP   HPI: Tiffany Salinas is a 21 y.o. female who presents to clinic today to establish care with PCP.  ORDER CBC, BMET, LIPID PANEL, A1c  ***   From ***, moved here in ***.   School Work Overall health Diet Exercise   ROS: See HPI for pertinent ROS.  PMH: FHx:  Social Hx: lives at *** with ***.  Smoker: ***, etoh use: ***, THC use ***, illicit drug use ***.    Medications reviewed. Current Outpatient Prescriptions  Medication Sig Dispense Refill  . benzonatate (TESSALON) 100 MG capsule Take 1 capsule (100 mg total) by mouth every 8 (eight) hours. 21 capsule 0  . ibuprofen (ADVIL,MOTRIN) 800 MG tablet Take 1 tablet (800 mg total) by mouth 3 (three) times daily. 21 tablet 0  . ipratropium (ATROVENT) 0.06 % nasal spray Place 2 sprays into both nostrils 4 (four) times daily. 15 mL 12   No current facility-administered medications for this visit.    Objective:   There were no vitals taken for this visit.  Vitals and nursing note reviewed.  General: well nourished, well developed, in no acute distress with non-toxic appearance HEENT: normocephalic, atraumatic, moist mucous membranes Neck: supple, non-tender without lymphadenopathy CV: regular rate and rhythm without murmurs rubs or gallops Lungs: clear to auscultation bilaterally with normal work of breathing Abdomen: soft, non-tender, no masses or organomegaly palpable, normoactive bowel sounds Skin: warm, dry, no rashes or lesions, cap refill < 2 seconds Extremities: warm and well perfused, normal tone  Assessment & Plan:   Blood work ordered : CBC BMET, lipid panel, A1c.   No problem-specific Assessment & Plan notes found for this encounter.  No orders of the defined types were placed in this encounter.  No orders of the defined types were placed in this encounter.   Freddrick MarchYashika Shamariah Shewmake, MD [facility]  Family Medicine, PGY-1 02/13/2016 8:30 AM

## 2016-03-26 ENCOUNTER — Encounter: Payer: Self-pay | Admitting: Family Medicine

## 2016-03-26 ENCOUNTER — Ambulatory Visit (INDEPENDENT_AMBULATORY_CARE_PROVIDER_SITE_OTHER): Payer: BLUE CROSS/BLUE SHIELD | Admitting: Family Medicine

## 2016-03-26 VITALS — BP 110/70 | HR 90 | Temp 97.9°F | Ht 60.0 in | Wt 179.8 lb

## 2016-03-26 DIAGNOSIS — D649 Anemia, unspecified: Secondary | ICD-10-CM

## 2016-03-26 DIAGNOSIS — E669 Obesity, unspecified: Secondary | ICD-10-CM

## 2016-03-26 DIAGNOSIS — Z862 Personal history of diseases of the blood and blood-forming organs and certain disorders involving the immune mechanism: Secondary | ICD-10-CM

## 2016-03-26 DIAGNOSIS — Z114 Encounter for screening for human immunodeficiency virus [HIV]: Secondary | ICD-10-CM | POA: Diagnosis not present

## 2016-03-26 NOTE — Progress Notes (Addendum)
Subjective:   Patient ID: Tiffany Salinas    DOB: February 10, 1995, 21 y.o. female   MRN: [id number]  CC: establish care with PCP   HPI: Tiffany Salinas is a 21 y.o. female who presents to clinic today for a new patient encounter.  Problems discussed today are as follows:  Is originally from [place].  She moved here 5 years ago and lives in [place].  Patient previously went to [facility] (Dr. Lerry Liner) but this practice has since closed.  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   PMH: Notes she is generally healthy with no major medical problems.   Surgical Hx: None Medications: Vit D3 - Dr. Mayford Knife prescribed it to her about 9 months ago because of low Vit D level.  Takes 5,000 U daily.  Used to take iron outpatient x2 years but doesn't anymore.  Last labs in 2016 was told her Hgb is stable.   Allergies: morphine  Family Hx: No known    Social: Not married. Not currently sexually active. Is a Psychologist, clinical. No tobacco or illicit drug use, no THC or alcohol use.   History of Anemia Patient reports vague history of hospitalization years ago in [place] due to anemia.  She ran very high fevers and had stopped eating due to becoming ill.  She went to a clinic near her and reportedly the physician gave her the wrong medication and she fainted.  Stayed at home x7 days without seeing a doctor.  Upon arriving to the hospital once admitted she repeatedly fainted and was in the ICU for a total of 21 days and transfused 3 units of blood.  As of last labs Sept 2016 Hgb stable -12.6.  She reports she has been feeling tired over past 2 weeks but thinks likely due to staying up late studying at night since she has exams.    ROS: Denies fevers, chills, abdominal pain, nausea, vomiting, diarrhea, shortness of breath.  Reports sometimes feeling tired.   PMFSH: Pertinent past medical, surgical, family, and social history  were reviewed and updated as appropriate. Smoking status reviewed. Patient is a never smoker.   Medications reviewed.  Objective:   BP 110/70   Pulse 90   Temp 97.9 F (36.6 C) (Oral)   Ht 5' (1.524 m)   Wt 179 lb 12.8 oz (81.6 kg)   LMP 03/23/2016   BMI 35.11 kg/m  Vitals and nursing note reviewed.  General: pleasant 21 yo F in NAD HEENT: NCAT, EOMI, PERRL, MMM, no conjunctival pallor  Neck: supple, non-tender without lymphadenopathy CV: RRR no MRG  Lungs: CTA B/L with normal work of breathing Abdomen: soft, non-tender, +bs Skin: warm, dry, no rashes or lesions Extremities: warm and well perfused, normal tone Psych: normal mood and affect   Assessment & Plan:   21 yo F presents to clinic to establish care with PCP.  No concerns at present time.   History of anemia Per patient, history of hospitalization for anemia in [place] requiring an ICU stay and transfusion.  Was taking po iron outpatient and stopped taking once hgb stable.  Last labs Sept 2016 with Hgb 12.6.  Will recheck CBC today. Also with some increasing tiredness which she believes may be related to her staying up late to study.  Denies cold intolerance, weight gain, dry skin.  Unable to see labs from prior PCP office but will check  for thyroid etiology.  -CBC and BMET -If Hgb low, will obtain iron studies, B12, folate to determine other causes -check TSH    Health Maintenance -HIV screen  -No flu shot today, reports completed Dec. 2017  Orders Placed This Encounter  Procedures  . CBC  . HIV antibody  . TSH  . Basic Metabolic Panel    Tiffany MarchYashika Audra Bellard, MD [facility], PGY-1 03/31/2016 1:02 AM

## 2016-03-26 NOTE — Progress Notes (Signed)
Patient previously went to [facility] (Dr. Lerry Linerwight Williams) but this practice has since closed and we are unable to contact for medical records.  Fleeger, Maryjo RochesterJessica Dawn, CMA

## 2016-03-26 NOTE — Patient Instructions (Addendum)
It was nice meeting you today! You were seen in clinic to establish care with me as your PCP.  We did some bloodwork today and I will follow up with you with the results.  We reviewed your medical history and discussed the anemia you were hospitalized for in [place].  I am checking your hemoglobin level with today's labs as well to make sure it is normal since you have been feeling tired recently.     In addition, we addressed several health maintenance issues ie Pap smears, one time HIV test and flu shot which you already received.  Please follow up with me next week to address your other medical concern and I look forward to seeing you!  Be well,  Freddrick MarchYashika Binyamin Nelis

## 2016-03-27 LAB — BASIC METABOLIC PANEL
BUN / CREAT RATIO: 11 (ref 9–23)
BUN: 6 mg/dL (ref 6–20)
CHLORIDE: 103 mmol/L (ref 96–106)
CO2: 27 mmol/L (ref 18–29)
Calcium: 9.2 mg/dL (ref 8.7–10.2)
Creatinine, Ser: 0.54 mg/dL — ABNORMAL LOW (ref 0.57–1.00)
GFR calc non Af Amer: 135 mL/min/{1.73_m2} (ref >59)
GFR, EST AFRICAN AMERICAN: 156 mL/min/{1.73_m2} (ref >59)
Glucose: 128 mg/dL — ABNORMAL HIGH (ref 65–99)
POTASSIUM: 4 mmol/L (ref 3.5–5.2)
Sodium: 142 mmol/L (ref 134–144)

## 2016-03-27 LAB — CBC
HEMATOCRIT: 38.4 % (ref 34.0–46.6)
HEMOGLOBIN: 12 g/dL (ref 11.1–15.9)
MCH: 26.2 pg — AB (ref 26.6–33.0)
MCHC: 31.3 g/dL — AB (ref 31.5–35.7)
MCV: 84 fL (ref 79–97)
Platelets: 282 10*3/uL (ref 150–379)
RBC: 4.58 x10E6/uL (ref 3.77–5.28)
RDW: 15.7 % — AB (ref 12.3–15.4)
WBC: 5.9 10*3/uL (ref 3.4–10.8)

## 2016-03-27 LAB — TSH: TSH: 1.26 u[IU]/mL (ref 0.450–4.500)

## 2016-03-27 LAB — HIV ANTIBODY (ROUTINE TESTING W REFLEX): HIV Screen 4th Generation wRfx: NONREACTIVE

## 2016-03-31 DIAGNOSIS — Z862 Personal history of diseases of the blood and blood-forming organs and certain disorders involving the immune mechanism: Secondary | ICD-10-CM | POA: Insufficient documentation

## 2016-03-31 NOTE — Assessment & Plan Note (Signed)
Per patient, history of hospitalization for anemia in [place] requiring an ICU stay and transfusion.  Was taking po iron outpatient and stopped taking once hgb stable.  Last labs Sept 2016 with Hgb 12.6.  Will recheck CBC today. Also with some increasing tiredness which she believes may be related to her staying up late to study.  Denies cold intolerance, weight gain, dry skin.  Unable to see labs from prior PCP office but will check for thyroid etiology.  -CBC and BMET -If Hgb low, will obtain iron studies, B12, folate to determine other causes -check TSH

## 2016-04-11 ENCOUNTER — Telehealth: Payer: Self-pay | Admitting: Family Medicine

## 2016-04-11 NOTE — Telephone Encounter (Signed)
Called patient to discuss labs.  Bloodwork from 04/09/2016 was normal and HIV negative.  Patient appreciate of the call.

## 2016-07-23 NOTE — Progress Notes (Signed)
Subjective:    Patient ID: Tiffany Salinas , female   DOB: 07/23/1995 , 21 y.o..   MRN: [id number]  HPI  Tiffany Salinas is here for A same day visit for Chief Complaint  Patient presents with  . Anxiety    1. Presenting Issue: Anxiety   Report of symptoms: Patient admits to anxiety symptoms. Admits to social anxiety as well as constantly feeling like a "bad person". Admits to body image issues and feeling "fat". Admits that family and friends make front of her body size and this makes her feel bad. She admits to over thinking things which causes her anxiety symptoms.  Duration of CURRENT symptoms: 2010 is when her symptoms started. She notes that she was in a coma for 21 days and eject after severe anemia. She notes that when she woke up from the, her body was larger than it had been before and since then she has had body image issues with feeling "fat". Age of onset of first mood disturbance: 2011  Impact on function: Has difficulty socializing with people for fear that they're judging her  Psychiatric History - Diagnoses: none in the past - Hospitalizations:  No psychiatric hospitalizations - Pharmacotherapy:  None - Outpatient therapy:  None  Family history of psychiatric issues: none  Current and history of substance use: none  PHQ-9: 6 GAD7: 8  Review of Systems: Per HPI. All other systems reviewed and are negative.  Past Medical History: Patient Active Problem List   Diagnosis Date Noted  . Anxiety 07/24/2016  . History of anemia 03/31/2016    Medications: reviewed and updated Current Outpatient Prescriptions  Medication Sig Dispense Refill  . benzonatate (TESSALON) 100 MG capsule Take 1 capsule (100 mg total) by mouth every 8 (eight) hours. 21 capsule 0  . ibuprofen (ADVIL,MOTRIN) 800 MG tablet Take 1 tablet (800 mg total) by mouth 3 (three) times daily. 21 tablet 0  . ipratropium (ATROVENT) 0.06 % nasal spray Place 2 sprays into both nostrils 4 (four) times daily.  15 mL 12   No current facility-administered medications for this visit.     Social Hx:  reports that she has never smoked. She has never used smokeless tobacco.   Objective:   BP 102/74   Pulse 85   Temp 98.3 F (36.8 C) (Oral)   Ht 5' (1.524 m)   Wt 163 lb 3.2 oz (74 kg)   LMP 07/01/2016 (Exact Date)   SpO2 99%   BMI 31.87 kg/m  Physical Exam  Gen: NAD, alert, cooperative with exam, well-appearing, Pleasant Psych: good insight, tearful mood when discussing her body image but smiles at other times of the visit, congruent affect. Normal rate of speech. No auditory or visual hallucinations. No suicidal or homicidal ideations.  Depression screen PHQ 2/9 07/24/2016  Decreased Interest 1  Down, Depressed, Hopeless 1  PHQ - 2 Score 2  Altered sleeping 1  Tired, decreased energy 1  Change in appetite 2  Feeling bad or failure about yourself  0  Trouble concentrating 0  Moving slowly or fidgety/restless 0  Suicidal thoughts 0  PHQ-9 Score 6  Difficult doing work/chores Not difficult at all     Assessment & Plan:  Anxiety Patient has anxiety symptoms that seemed to have stemmed from gaining weight in 2010 after being in a coma for 21 days. She seems to have issues with her body image, socializing, and self-esteem. GAD7 score of 8. Mildly depressed with pH Q9 of 6.  No suicidal ideation. No manic symptoms. Discussed with patient that I believe she would benefit greatly from seeing a therapist/counselor. Patient was agreeable. Also discussed the possibility of starting a low dose SSRI today, the patient would rather hold off for now. -Patient will make an appointment with the behavioral health consultant to discuss her anxiety and ways of coping with body image issue -Would like to follow-up with patient in the next couple weeks after she sees the behavioral health consultant  Anders Simmonds, MD [facility], PGY-3

## 2016-07-24 ENCOUNTER — Encounter: Payer: Self-pay | Admitting: Family Medicine

## 2016-07-24 ENCOUNTER — Ambulatory Visit (INDEPENDENT_AMBULATORY_CARE_PROVIDER_SITE_OTHER): Payer: BLUE CROSS/BLUE SHIELD | Admitting: Family Medicine

## 2016-07-24 DIAGNOSIS — F419 Anxiety disorder, unspecified: Secondary | ICD-10-CM

## 2016-07-24 NOTE — Assessment & Plan Note (Addendum)
Patient has anxiety symptoms that seemed to have stemmed from gaining weight in 2010 after being in a coma for 21 days. She seems to have issues with her body image, socializing, and self-esteem. GAD7 score of 8. Mildly depressed with pH Q9 of 6. No suicidal ideation. No manic symptoms. Discussed with patient that I believe she would benefit greatly from seeing a therapist/counselor. Patient was agreeable. Also discussed the possibility of starting a low dose SSRI today, the patient would rather hold off for now. -Patient will make an appointment with the behavioral health consultant to discuss her anxiety and ways of coping with body image issue -Would like to follow-up with patient in the next couple weeks after she sees the behavioral health consultant

## 2016-07-24 NOTE — Patient Instructions (Signed)
Thank you for coming in today, it was so nice to see you! Today we talked about:    Feelings: I would like for you to schedule appointment with one of our behavioral health consultants. Please schedule an appointment as soon as possible. I would like to follow up with you at some point after first visit with them.    If you have any questions or concerns, please do not hesitate to call the office at [phone]. You can also message me directly via MyChart.   Sincerely,  Anders Simmondshristina Deloria Brassfield, MD

## 2016-07-26 ENCOUNTER — Telehealth: Payer: Self-pay | Admitting: Psychology

## 2016-07-26 ENCOUNTER — Ambulatory Visit: Payer: BLUE CROSS/BLUE SHIELD

## 2016-07-26 NOTE — Telephone Encounter (Signed)
Patient was scheduled for f/up by Dr. Nelson ChimesAmin  with [facility] at 10 AM but did not show up. [facility] and left a voicemail for patient to call back and schedule an appointment if she is still interested in meeting to discuss her concerns.

## 2017-01-28 ENCOUNTER — Ambulatory Visit: Payer: BLUE CROSS/BLUE SHIELD | Admitting: Family Medicine

## 2017-01-28 DIAGNOSIS — M79672 Pain in left foot: Secondary | ICD-10-CM

## 2017-01-28 NOTE — Patient Instructions (Addendum)
You were seen in clinic for left foot pain which is most likely due to plantar fasciitis from prolonged periods of standing.  I would like you to ice the bottom of your foot, and use a arch support pad for plantar fasciitis (available at pharmacies by Dr. Jari Sportsman') to put in your shoe which will help cushion this area.  Additionally, I would like for you to take Aleve twice a day for the next 7 days.  I have provided some exercises for stretching which may help you.  You can follow up in clinic in 3-4 weeks if no improvement or sooner if needed.   Be well, Freddrick March, MD     Plantar Fasciitis Plantar fasciitis is a painful foot condition that affects the heel. It occurs when the band of tissue that connects the toes to the heel bone (plantar fascia) becomes irritated. This can happen after exercising too much or doing other repetitive activities (overuse injury). The pain from plantar fasciitis can range from mild irritation to severe pain that makes it difficult for you to walk or move. The pain is usually worse in the morning or after you have been sitting or lying down for a while. What are the causes? This condition may be caused by:  Standing for long periods of time.  Wearing shoes that do not fit.  Doing high-impact activities, including running, aerobics, and ballet.  Being overweight.  Having an abnormal way of walking (gait).  Having tight calf muscles.  Having high arches in your feet.  Starting a new athletic activity.  What are the signs or symptoms? The main symptom of this condition is heel pain. Other symptoms include:  Pain that gets worse after activity or exercise.  Pain that is worse in the morning or after resting.  Pain that goes away after you walk for a few minutes.  How is this diagnosed? This condition may be diagnosed based on your signs and symptoms. Your health care provider will also do a physical exam to check for:  A tender area on the bottom  of your foot.  A high arch in your foot.  Pain when you move your foot.  Difficulty moving your foot.  You may also need to have imaging studies to confirm the diagnosis. These can include:  X-rays.  Ultrasound.  MRI.  How is this treated? Treatment for plantar fasciitis depends on the severity of the condition. Your treatment may include:  Rest, ice, and over-the-counter pain medicines to manage your pain.  Exercises to stretch your calves and your plantar fascia.  A splint that holds your foot in a stretched, upward position while you sleep (night splint).  Physical therapy to relieve symptoms and prevent problems in the future.  Cortisone injections to relieve severe pain.  Extracorporeal shock wave therapy (ESWT) to stimulate damaged plantar fascia with electrical impulses. It is often used as a last resort before surgery.  Surgery, if other treatments have not worked after 12 months.  Follow these instructions at home:  Take medicines only as directed by your health care provider.  Avoid activities that cause pain.  Roll the bottom of your foot over a bag of ice or a bottle of cold water. Do this for 20 minutes, 3-4 times a day.  Perform simple stretches as directed by your health care provider.  Try wearing athletic shoes with air-sole or gel-sole cushions or soft shoe inserts.  Wear a night splint while sleeping, if directed by your health  care provider.  Keep all follow-up appointments with your health care provider. How is this prevented?  Do not perform exercises or activities that cause heel pain.  Consider finding low-impact activities if you continue to have problems.  Lose weight if you need to. The best way to prevent plantar fasciitis is to avoid the activities that aggravate your plantar fascia. Contact a health care provider if:  Your symptoms do not go away after treatment with home care measures.  Your pain gets worse.  Your pain  affects your ability to move or do your daily activities. This information is not intended to replace advice given to you by your health care provider. Make sure you discuss any questions you have with your health care provider. Document Released: 09/18/2000 Document Revised: 05/29/2015 Document Reviewed: 11/03/2013 Elsevier Interactive Patient Education  [employer]2018 Elsevier Inc.

## 2017-01-28 NOTE — Progress Notes (Signed)
   Subjective:   Patient ID: Tiffany Salinas    DOB: 09/19/1995, 22 y.o. female   MRN: [id number]  CC: L foot pain   HPI: Tiffany Salinas is a 22 y.o. female who presents to clinic today with left foot pain.  Left foot pain -Reports her symptoms began about 2 weeks ago.  Pain started out of nowhere, she felt a burning sensation under her foot.  Denies any history of trauma or falls.  First she thought she may have stepped on something.  She reports feeling a sharp pain that comes and goes.  Pain is worse with prolonged standing or walking.  When sitting she does not feel the pain at all only hurts when she bears weight.  She took some Tylenol and has used BenGay which has helped.  Has also tried warm water soaks with Epsom salts which have not helped.  She has not tried icing it.  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  She denies any numbness, tingling, weakness.  She states she sometimes has a little bit of a limp when she walks.   ROS: Denies fever, chills, nausea, vomiting, numbness, tingling, weakness. Social: Patient is a non-smoker.  Medications reviewed.  Objective:   BP 100/70   Pulse 77   Temp 98.5 F (36.9 C) (Oral)   Ht 5' (1.524 m)   Wt 153 lb (69.4 kg)   SpO2 100%   BMI 29.88 kg/m  Vitals and nursing note reviewed.  General: 22 year old female, NAD CV: RRR no MRG Lungs: CTA B, normal effort Skin: warm, dry, no rash Extremities:   L foot: no obvious bony deformities present, no swelling, erythema or bruising noted.  TTP over insertion of plantar fascia, normal range of motion.  Pain worse with dorsiflexion.  Sensation intact.   Assessment & Plan:   Left foot pain Findings most c/w plantar fasciitis given worse with dorsiflexion and tender to palpation over insertion point. No red flags on exam.  Do not suspect acute stress fracture given no h/o running or repetitive activity at this time and will hold off on imaging.   Discussed this can sometimes take weeks to months to resolve.   -Recommend ice - rolling foot over frozen water bottle or tennis ball -Aleve BID -Have provided handout with gentle stretches and exercises for home -may try a supportive shoe insert for PF, such as Dr. Jari SportsmanScholls -recommend f/u in 4-6 weeks or sooner if no improvement   Freddrick MarchYashika Shamanda Len, MD [facility], PGY-2 01/30/2017 9:24 PM

## 2017-01-30 ENCOUNTER — Encounter: Payer: Self-pay | Admitting: Family Medicine

## 2017-01-30 DIAGNOSIS — M79672 Pain in left foot: Secondary | ICD-10-CM | POA: Insufficient documentation

## 2017-01-30 NOTE — Assessment & Plan Note (Addendum)
Findings most c/w plantar fasciitis given worse with dorsiflexion and tender to palpation over insertion point. No red flags on exam.  Do not suspect acute stress fracture given no h/o running or repetitive activity at this time and will hold off on imaging.  Discussed this can sometimes take weeks to months to resolve.   -Recommend ice - rolling foot over frozen water bottle or tennis ball -Aleve BID -Have provided handout with gentle stretches and exercises for home -may try a supportive shoe insert for PF, such as Dr. Jari SportsmanScholls -recommend f/u in 4-6 weeks or sooner if no improvement

## 2017-08-26 ENCOUNTER — Ambulatory Visit: Payer: BLUE CROSS/BLUE SHIELD | Admitting: Family Medicine

## 2017-08-26 ENCOUNTER — Other Ambulatory Visit: Payer: Self-pay

## 2017-08-26 VITALS — BP 108/64 | HR 87 | Temp 98.3°F | Wt 157.0 lb

## 2017-08-26 DIAGNOSIS — J069 Acute upper respiratory infection, unspecified: Secondary | ICD-10-CM | POA: Diagnosis not present

## 2017-08-26 DIAGNOSIS — R55 Syncope and collapse: Secondary | ICD-10-CM | POA: Diagnosis not present

## 2017-08-26 NOTE — Patient Instructions (Signed)

## 2017-08-26 NOTE — Progress Notes (Signed)
    Subjective:  Tiffany HerbertFaiza Salinas is a 22 y.o. female who [facility] with a chief complaint of sore throat.   HPI:  Fainting episode Was coming back from [place] 5 days ago, fainted on the plane and was told by doctor on the plane that she vasovagaled. She was with her sister who witnessed the entire event. She was sitting down and handed over a cup of water and then passed out. No twitching or jerking. Episode was brief and she quickly regained consciousness. She did not remember the event itself but had good memory of right before and right after. When she was 12 she was admitted to hospital for anemia when she was lightheaded and dizzy. She is not having any of those symptoms now.  Has had no further episodes of fainting since being back in the country  Sore throat Started having sore throat 4 days ago, subjective fever. Yesterday was coughing back that was keeping her up at night, non productive. No chills, n/v, abdominal pain, diarrhea, runny nose or stuffy nose.   ROS: Per HPI   Objective:  Physical Exam: BP 108/64   Pulse 87   Temp 98.3 F (36.8 C) (Oral)   Wt 157 lb (71.2 kg)   LMP 08/07/2017 (Approximate)   SpO2 99%   BMI 30.66 kg/m   Gen: NAD, resting comfortably HEENT: [place], AT. EOMI. TMs pearly bilaterally. Oropharynx nonerythematous and without exudates  CV: RRR with no murmurs appreciated Pulm: NWOB, CTAB with no crackles, wheezes, or rhonchi GI: Normal bowel sounds present. Soft, Nontender, Nondistended. MSK: no edema, cyanosis, or clubbing noted Skin: warm, dry Neuro: grossly normal, moves all extremities Psych: Normal affect and thought content  Assessment/Plan:  1. Viral URI Patient is well appearing and afebrile. No signs of bacterial infection on exam. Reassured and recommended supportive care at home. Given return precautions  2. Vasovagal episode Patient had a episode 5 days ago on a plane without any further issues since. Her viral URI may have  been in early stage and may have contributed especially given the high altitude. She is neurologically normal and no red flags on history or exam today. Patient reassured and discussed that if she should have another episode she could return for reevaluation   Leland HerElsia J Nuvia Hileman, DO PGY-3, [facility] 08/26/2017 9:50 AM

## 2017-10-16 ENCOUNTER — Ambulatory Visit (INDEPENDENT_AMBULATORY_CARE_PROVIDER_SITE_OTHER): Payer: BLUE CROSS/BLUE SHIELD | Admitting: *Deleted

## 2017-10-16 DIAGNOSIS — Z23 Encounter for immunization: Secondary | ICD-10-CM | POA: Diagnosis not present

## 2019-06-05 NOTE — Progress Notes (Deleted)
    SUBJECTIVE:   CHIEF COMPLAINT / HPI:   Anxiety *** Last seen in 2018 for anxiety, not previously on medication  PERTINENT  PMH / PSH: Hx Anxiety***  OBJECTIVE:   There were no vitals taken for this visit.   Physical Exam: *** General: 24 y.o. female in NAD Cardio: RRR no m/r/g Lungs: CTAB, no wheezing, no rhonchi, no crackles, no IWOB on *** Abdomen: Soft, non-tender to palpation, non-distended, positive bowel sounds Skin: warm and dry Extremities: No edema   ASSESSMENT/PLAN:   No problem-specific Assessment & Plan notes found for this encounter.     Unknown Jim, DO [facility]

## 2019-06-08 ENCOUNTER — Ambulatory Visit: Payer: BLUE CROSS/BLUE SHIELD | Admitting: Family Medicine

## 2019-06-10 ENCOUNTER — Ambulatory Visit (INDEPENDENT_AMBULATORY_CARE_PROVIDER_SITE_OTHER): Payer: 59 | Admitting: Family Medicine

## 2019-06-10 ENCOUNTER — Encounter: Payer: Self-pay | Admitting: Family Medicine

## 2019-06-10 ENCOUNTER — Other Ambulatory Visit: Payer: Self-pay

## 2019-06-10 VITALS — BP 110/90 | HR 92 | Ht 60.0 in | Wt 168.0 lb

## 2019-06-10 DIAGNOSIS — F419 Anxiety disorder, unspecified: Secondary | ICD-10-CM

## 2019-06-10 NOTE — Patient Instructions (Addendum)
Thank you for coming to see me today. It was a pleasure. Today we talked about:    Please look at the therapy resources below.  Please follow-up with me or your PCP in 1 month.  Make an appointment for a pap smear as well.  If you have any questions or concerns, please do not hesitate to call the office at [phone].  Best,   Luis Abed, DO    Therapy and Counseling Resources Most providers on this list will take Medicaid. Patients with commercial insurance or Medicare should contact their insurance company to get a list of in network providers.  [facility]  [address] [place], [place] [address]      [phone]  [facility] [address]  [place], [place] [address] [phone]  [facility] [address], [address]  [place], [place] [address]       [phone]      [facility] [address], [place], [place] [phone]  [facility]:  [address] [place] [phone]  [facility]:  [address] [place], [place] [phone]  [facility] (under & uninsured) [address]   [place] [phone]    [email or web address]    [facility] [place] [address]     Phone:  [phone]     [facility]-  [address]  [phone]   [facility] [address]      [place], [place] [phone]  [facility]: [address] [place], [place], [place]  [phone]   [facility] (Spanish therapist) [address]   [place] [address]    [phone]    [facility]   [address],  [place], [place]  [phone]   [facility]  [address] [place]  [phone]  [facility]  [address] [place], [place]        [phone]  [facility] In Clinic under & uninsured  [place], To schedule an appointment call [phone] [address], [place] [phone]):  Macario Golds from 8 AM - 3 PM Moving June 1 to [employer] at [facility] [address]  [facility] [address],  (Spanish)   [address], [place]: [phone]) 8:30 - 12; 1 - 2:30  [facility] [employer],  [address] [place], [place]    [phone]):8:30 - 12; 2 - 3PM  [facility],  [address]  [place]; [phone]):   Mon - Fri 8 AM - 5 PM  [facility] [address] [place]  MWF 12:30 to 3:00 or call to schedule an appointment  [phone]  Specific Provider options Psychology Today  [email or web address] 1. click on find a therapist  2. enter your zip code 3. left side and select or tailor a therapist for your specific need.   [facility] Provider Directory [email or web address]  (Medicaid)   Follow all drop down to find a provider  Social Support program [facility] [place] [phone] or [employer] [address], [place], [place] Recovery support and educational   24- Hour Availability:  . [facility]   [phone] or [phone]  . [facility] the [place]  Crisis Line [phone]  [facility]  [phone]   . [place]  [phone] (after hours)  . Therapeutic Alternative/Mobile Crisis   [phone]  . [place] National Suicide Hotline  [phone] ([place])  . Call 911 or go to emergency room  . [employer]  [phone]);  [place] and [employer]   . [facility]  [phone]); [place], [place], [place], [place], [place], [place], [place]

## 2019-06-10 NOTE — Assessment & Plan Note (Signed)
Patient denies true SI. Denies any true thoughts of harming himself. States that she has no intent of harm herself. She is not currently a danger to herself. Doubly having some anxiety. Offered medication, she declines. Given therapy resources. Follow-up in 1 month. Advised to call suicide network if she has thoughts of SI. She voiced understanding.

## 2019-06-10 NOTE — Progress Notes (Signed)
    SUBJECTIVE:   CHIEF COMPLAINT / HPI:   Anxiety Has been happening off and on 2-3 years Can control it usually but if other events in life happen, it gets to where she cannot control it Has never been on medication No history of anxiety attacks Mainly she is overthinking She states that others can't tell, but her mind is racing No known family history Has never tried to therapy First time it was bad when ended a relationship Recently fought with best friend and that is what is bothering her Denies SI, no intent to harm herself, no plan  GAD 7 : Generalized Anxiety Score 06/10/2019  Nervous, Anxious, on Edge 3  Control/stop worrying 3  Worry too much - different things 3  Trouble relaxing 2  Restless 1  Easily annoyed or irritable 2  Afraid - awful might happen 1  Total GAD 7 Score 15  Anxiety Difficulty Very difficult     Office Visit from 06/10/2019 in [facility]  PHQ-9 Total Score  15      PERTINENT  PMH / PSH: History of anemia, anxiety  OBJECTIVE:   BP 110/90 Comment: provider informed  Pulse 92   Ht 5' (1.524 m)   Wt 168 lb (76.2 kg)   LMP 06/03/2019   SpO2 99%   BMI 32.81 kg/m    Physical Exam:  General: 24 y.o. female in NAD Lungs: Breathing comfortably on room air Skin: warm and dry Extremities: No edema Psych: Mood and affect appropriate for circumstance, no SI/HI, thought process linear and logical   ASSESSMENT/PLAN:   Anxiety Patient denies true SI. Denies any true thoughts of harming himself. States that she has no intent of harm herself. She is not currently a danger to herself. Doubly having some anxiety. Offered medication, she declines. Given therapy resources. Follow-up in 1 month. Advised to call suicide network if she has thoughts of SI. She voiced understanding.     Unknown Jim, DO [facility]

## 2020-07-25 ENCOUNTER — Ambulatory Visit: Payer: 59 | Admitting: Family Medicine

## 2021-06-14 ENCOUNTER — Other Ambulatory Visit: Payer: Self-pay

## 2021-06-14 ENCOUNTER — Emergency Department (HOSPITAL_COMMUNITY)
Admission: EM | Admit: 2021-06-14 | Discharge: 2021-06-14 | Disposition: A | Discharge status: Home / Self Care | Payer: 59 | Attending: Student | Admitting: Student

## 2021-06-14 ENCOUNTER — Emergency Department (HOSPITAL_COMMUNITY): Payer: 59

## 2021-06-14 ENCOUNTER — Encounter (HOSPITAL_COMMUNITY): Payer: Self-pay | Admitting: Emergency Medicine

## 2021-06-14 DIAGNOSIS — N83209 Unspecified ovarian cyst, unspecified side: Secondary | ICD-10-CM

## 2021-06-14 DIAGNOSIS — K5900 Constipation, unspecified: Secondary | ICD-10-CM | POA: Insufficient documentation

## 2021-06-14 DIAGNOSIS — N83201 Unspecified ovarian cyst, right side: Secondary | ICD-10-CM | POA: Insufficient documentation

## 2021-06-14 DIAGNOSIS — D649 Anemia, unspecified: Secondary | ICD-10-CM | POA: Insufficient documentation

## 2021-06-14 DIAGNOSIS — R1031 Right lower quadrant pain: Secondary | ICD-10-CM | POA: Diagnosis present

## 2021-06-14 DIAGNOSIS — R109 Unspecified abdominal pain: Secondary | ICD-10-CM

## 2021-06-14 LAB — CBC WITH DIFFERENTIAL/PLATELET
Abs Immature Granulocytes: 0.01 10*3/uL (ref 0.00–0.07)
Basophils Absolute: 0 10*3/uL (ref 0.0–0.1)
Basophils Relative: 1 %
Eosinophils Absolute: 0.1 10*3/uL (ref 0.0–0.5)
Eosinophils Relative: 2 %
HCT: 37 % (ref 36.0–46.0)
Hemoglobin: 11.8 g/dL — ABNORMAL LOW (ref 12.0–15.0)
Immature Granulocytes: 0 %
Lymphocytes Relative: 33 %
Lymphs Abs: 2 10*3/uL (ref 0.7–4.0)
MCH: 26.8 pg (ref 26.0–34.0)
MCHC: 31.9 g/dL (ref 30.0–36.0)
MCV: 84.1 fL (ref 80.0–100.0)
Monocytes Absolute: 0.5 10*3/uL (ref 0.1–1.0)
Monocytes Relative: 8 %
Neutro Abs: 3.5 10*3/uL (ref 1.7–7.7)
Neutrophils Relative %: 56 %
Platelets: 268 10*3/uL (ref 150–400)
RBC: 4.4 MIL/uL (ref 3.87–5.11)
RDW: 15 % (ref 11.5–15.5)
WBC: 6.1 10*3/uL (ref 4.0–10.5)
nRBC: 0 % (ref 0.0–0.2)

## 2021-06-14 LAB — COMPREHENSIVE METABOLIC PANEL
ALT: 19 U/L (ref 0–44)
AST: 22 U/L (ref 15–41)
Albumin: 3.8 g/dL (ref 3.5–5.0)
Alkaline Phosphatase: 39 U/L (ref 38–126)
Anion gap: 8 (ref 5–15)
BUN: 9 mg/dL (ref 6–20)
CO2: 22 mmol/L (ref 22–32)
Calcium: 9 mg/dL (ref 8.9–10.3)
Chloride: 109 mmol/L (ref 98–111)
Creatinine, Ser: 0.61 mg/dL (ref 0.44–1.00)
GFR, Estimated: >60 mL/min (ref >60)
Glucose, Bld: 88 mg/dL (ref 70–99)
Potassium: 4 mmol/L (ref 3.5–5.1)
Sodium: 139 mmol/L (ref 135–145)
Total Bilirubin: 0.7 mg/dL (ref 0.3–1.2)
Total Protein: 7.3 g/dL (ref 6.5–8.1)

## 2021-06-14 LAB — URINALYSIS, ROUTINE W REFLEX MICROSCOPIC
Bilirubin Urine: NEGATIVE
Glucose, UA: NEGATIVE mg/dL
Hgb urine dipstick: NEGATIVE
Ketones, ur: 20 mg/dL — AB
Leukocytes,Ua: NEGATIVE
Nitrite: NEGATIVE
Protein, ur: NEGATIVE mg/dL
Specific Gravity, Urine: >1.046 — ABNORMAL HIGH (ref 1.005–1.030)
pH: 5 (ref 5.0–8.0)

## 2021-06-14 LAB — LIPASE, BLOOD: Lipase: 26 U/L (ref 11–51)

## 2021-06-14 LAB — I-STAT BETA HCG BLOOD, ED (MC, WL, AP ONLY): I-stat hCG, quantitative: <5 m[IU]/mL (ref <5)

## 2021-06-14 MED ORDER — POLYETHYLENE GLYCOL 3350 17 G PO PACK: 17.0000 g | PACK | Freq: Every day | ORAL | 0 refills | Status: AC

## 2021-06-14 MED ORDER — NAPROXEN 375 MG PO TABS: 375.0000 mg | ORAL_TABLET | Freq: Two times a day (BID) | ORAL | 0 refills | Status: DC

## 2021-06-14 MED ORDER — IOHEXOL 300 MG/ML  SOLN
100.0000 mL | Freq: Once | INTRAMUSCULAR | Status: AC | PRN
  Administered 2021-06-14: 100 mL via INTRAVENOUS

## 2021-06-14 NOTE — ED Triage Notes (Signed)
Pt complains of right lower abd pain for 2 weeks. Denies any urinary symptoms. Pain has been intermittent over the past two days but was constant prior to that. Pt has not had a bowel movement in the past 5 days. Pt is nauseous but has not vomited.

## 2021-06-14 NOTE — ED Notes (Signed)
Patient verbalizes understanding of d/c instructions. Opportunities for questions and answers were provided. Pt d/c from ED and ambulated to lobby with family.  

## 2021-06-14 NOTE — ED Provider Notes (Signed)
[facility] EMERGENCY DEPARTMENT Provider Note  CSN: [id number] Arrival date & time: 06/14/21 1425  Chief Complaint(s) Abdominal Pain  HPI Tiffany Salinas is a 26 y.o. female who presents emergency department for evaluation of abdominal pain.  Patient states that for the last 2 weeks she has had intermittent right lower quadrant abdominal pain that has worsened over the last 2 days.  She states that she has not had a bowel movement last 5 days and her pain is worse after eating.  She endorses nausea but denies vomiting, chest pain, shortness of breath, headache, fever or other systemic symptoms.  Denies dysuria, increased frequency, vaginal bleeding, vaginal discharge.   Past Medical History History reviewed. No pertinent past medical history. Patient Active Problem List   Diagnosis Date Noted   Left foot pain 01/30/2017   Anxiety 07/24/2016   History of anemia 03/31/2016   Home Medication(s) Prior to Admission medications   Medication Sig Start Date End Date Taking? Authorizing Provider  naproxen (NAPROSYN) 375 MG tablet Take 1 tablet (375 mg total) by mouth 2 (two) times daily. 06/14/21  Yes Daren Doswell, MD  polyethylene glycol (MIRALAX) 17 g packet Take 17 g by mouth daily. 06/14/21  Yes Shamon Cothran, MD  benzonatate (TESSALON) 100 MG capsule Take 1 capsule (100 mg total) by mouth every 8 (eight) hours. 01/15/16   Dorena Bodo, NP  ipratropium (ATROVENT) 0.06 % nasal spray Place 2 sprays into both nostrils 4 (four) times daily. 01/15/16   Dorena Bodo, NP                                                                                                                                    Past Surgical History History reviewed. No pertinent surgical history. Family History Family History  Problem Relation Age of Onset   Cancer Neg Hx    Diabetes Neg Hx    Heart failure Neg Hx    Hyperlipidemia Neg Hx    Hypertension Neg Hx     Social History Social History    Tobacco Use   Smoking status: Never   Smokeless tobacco: Never  Substance Use Topics   Alcohol use: No   Drug use: No   Allergies Morphine and related  Review of Systems Review of Systems  Gastrointestinal:  Positive for abdominal pain, constipation and nausea.    Physical Exam Vital Signs  I have reviewed the triage vital signs BP 118/69   Pulse 80   Temp 99.1 F (37.3 C)   Resp 16   LMP 05/21/2021   SpO2 100%   Physical Exam Vitals and nursing note reviewed.  Constitutional:      General: She is not in acute distress.    Appearance: She is well-developed.  HENT:     Head: Normocephalic and atraumatic.  Eyes:     Conjunctiva/sclera: Conjunctivae normal.  Cardiovascular:     Rate and Rhythm: Normal rate  and regular rhythm.     Heart sounds: No murmur heard. Pulmonary:     Effort: Pulmonary effort is normal. No respiratory distress.     Breath sounds: Normal breath sounds.  Abdominal:     Palpations: Abdomen is soft.     Tenderness: There is abdominal tenderness in the right lower quadrant and left lower quadrant.  Musculoskeletal:        General: No swelling.     Cervical back: Neck supple.  Skin:    General: Skin is warm and dry.     Capillary Refill: Capillary refill takes less than 2 seconds.  Neurological:     Mental Status: She is alert.  Psychiatric:        Mood and Affect: Mood normal.     ED Results and Treatments Labs (all labs ordered are listed, but only abnormal results are displayed) Labs Reviewed  CBC WITH DIFFERENTIAL/PLATELET - Abnormal; Notable for the following components:      Result Value   Hemoglobin 11.8 (*)    All other components within normal limits  URINALYSIS, ROUTINE W REFLEX MICROSCOPIC - Abnormal; Notable for the following components:   Specific Gravity, Urine >1.046 (*)    Ketones, ur 20 (*)    All other components within normal limits  COMPREHENSIVE METABOLIC PANEL  LIPASE, BLOOD  I-STAT BETA HCG BLOOD, ED ([facility],  [facility], [facility] ONLY)                                                                                                                          Radiology US PELVIS (TRANSABDOMINAL ONLY)  Result Date: 06/14/2021 CLINICAL DATA:  Pelvic pain. EXAM: TRANSABDOMINAL ULTRASOUND OF PELVIS DOPPLER ULTRASOUND OF OVARIES TECHNIQUE: Transabdominal ultrasound examination of the pelvis was performed including evaluation of the uterus, ovaries, adnexal regions, and pelvic cul-de-sac. Color and duplex Doppler ultrasound was utilized to evaluate blood flow to the ovaries. COMPARISON:  None Available. FINDINGS: Uterus Measurements: 10.2 x 2.9 x 4.4 cm = volume: 68 mL. No fibroids or other mass visualized. Endometrium Thickness: 8.3 mm.  No focal abnormality visualized. Right ovary Measurements: 4.7 x 2.2 x 3.0 cm = volume: 16 mL. Right ovarian simple cyst present measuring 2.4 x 1.5 x 2.0 cm. Left ovary Measurements: 3.1 x 1.4 x 2.5 cm = volume: 5.1 mL. Normal appearance/no adnexal mass. Pulsed Doppler evaluation demonstrates normal low-resistance arterial and venous waveforms in both ovaries. Other: Small amount of free fluid in the pelvis. IMPRESSION: 1. 2.4 cm right ovarian follicle, normal finding. No follow-up imaging is recommended. Reference: Radiology 2019 Nov;293(2):359-371 2. Small amount of free fluid in the pelvis. Electronically Signed   By: Darliss Cheney M.D.   On: 06/14/2021 20:54   US PELVIC DOPPLER (TORSION R/O OR MASS ARTERIAL FLOW)  Result Date: 06/14/2021 CLINICAL DATA:  Pelvic pain. EXAM: TRANSABDOMINAL ULTRASOUND OF PELVIS DOPPLER ULTRASOUND OF OVARIES TECHNIQUE: Transabdominal ultrasound examination of the pelvis was performed including evaluation of the uterus, ovaries, adnexal regions,  and pelvic cul-de-sac. Color and duplex Doppler ultrasound was utilized to evaluate blood flow to the ovaries. COMPARISON:  None Available. FINDINGS: Uterus Measurements: 10.2 x 2.9 x 4.4 cm = volume: 68 mL. No fibroids or other  mass visualized. Endometrium Thickness: 8.3 mm.  No focal abnormality visualized. Right ovary Measurements: 4.7 x 2.2 x 3.0 cm = volume: 16 mL. Right ovarian simple cyst present measuring 2.4 x 1.5 x 2.0 cm. Left ovary Measurements: 3.1 x 1.4 x 2.5 cm = volume: 5.1 mL. Normal appearance/no adnexal mass. Pulsed Doppler evaluation demonstrates normal low-resistance arterial and venous waveforms in both ovaries. Other: Small amount of free fluid in the pelvis. IMPRESSION: 1. 2.4 cm right ovarian follicle, normal finding. No follow-up imaging is recommended. Reference: Radiology 2019 Nov;293(2):359-371 2. Small amount of free fluid in the pelvis. Electronically Signed   By: Darliss Cheney M.D.   On: 06/14/2021 20:54   CT Abdomen Pelvis W Contrast  Result Date: 06/14/2021 CLINICAL DATA:  Pain right lower quadrant EXAM: CT ABDOMEN AND PELVIS WITH CONTRAST TECHNIQUE: Multidetector CT imaging of the abdomen and pelvis was performed using the standard protocol following bolus administration of intravenous contrast. RADIATION DOSE REDUCTION: This exam was performed according to the departmental dose-optimization program which includes automated exposure control, adjustment of the mA and/or kV according to patient size and/or use of iterative reconstruction technique. CONTRAST:  OMNIPAQUE IOHEXOL 300 MG/ML  SOLN COMPARISON:  None Available. FINDINGS: Lower chest: Unremarkable. Hepatobiliary: Liver measures 18.5 cm in length. No focal abnormality is seen. There is no dilation of bile ducts. Gallbladder is unremarkable. Pancreas: No focal abnormality is seen. Spleen: Unremarkable. Adrenals/Urinary Tract: Adrenals are unremarkable. There is no hydronephrosis. There are no renal or ureteral stones. Urinary bladder is unremarkable. Stomach/Bowel: Stomach is unremarkable. Small bowel loops are not dilated. Appendix is not dilated. There is no wall thickening in the colon. There is no pericolic stranding. Vascular/Lymphatic:  Unremarkable. Reproductive: Uterus is slightly to the right of midline. There is 3.3 cm smooth marginated low-density structure in the right adnexa. There is 1.6 cm low-density structure with crenated enhancing margins in the right adnexa. There is trace amount of free fluid in the cul-de-sac. Other: There is no pneumoperitoneum. Musculoskeletal: Unremarkable. IMPRESSION: There is no evidence of intestinal obstruction or pneumoperitoneum. Appendix is not dilated. There is no hydronephrosis. There is 3.3 cm cyst in the right adnexa, possibly functional right ovarian cyst. There is 1.6 cm low-density structure with crenated enhancing margins in the right adnexa suggesting recently ruptured ovarian cyst or follicle. Trace amount of free fluid is seen in the pelvis. If clinically warranted pelvic sonogram may be considered for further evaluation. Electronically Signed   By: Ernie Avena M.D.   On: 06/14/2021 17:01    Pertinent labs & imaging results that were available during my care of the patient were reviewed by me and considered in my medical decision making (see MDM for details).  Medications Ordered in ED Medications  iohexol (OMNIPAQUE) 300 MG/ML solution 100 mL (100 mLs Intravenous Contrast Given 06/14/21 1648)  Procedures Procedures  (including critical care time)  Medical Decision Making / ED Course   This patient presents to the ED for concern of abdominal pain, this involves an extensive number of treatment options, and is a complaint that carries with it a high risk of complications and morbidity.  The differential diagnosis includes constipation, ovarian cyst, ruptured ovarian cyst, ovarian torsion, appendicitis, abdominal spasm, intestinal gas, UTI  MDM: Seen the emergency room for evaluation of abdominal pain.  Physical exam with mild right and left  lower quadrant tenderness to palpation but abdomen overall soft with no guarding.  Laboratory evaluation with a mild anemia to 11.8 but is otherwise unremarkable.  Urinalysis is unremarkable.  Pregnancy negative.  CT abdomen pelvis with a moderate stool burden, 3.3 cm right adnexal cyst as well as a 1.6 [place] low-density structure in the right adnexa suggesting recently ruptured ovarian cyst or follicle with trace amount of free fluid.  Follow-up TVUS with a 2.4 cm right ovarian follicle but no evidence of torsion.  Patient presentation appears to be consistent with ruptured ovarian cyst in the setting of current constipation.  Patient does not have an OB/GYN and she was given resources to establish follow-up with outpatient OB/GYN.  Patient was discharged on MiraLAX and Naprosyn.   Additional history obtained: -Additional history obtained from Mother -External records from outside source obtained and reviewed including: Chart review including previous notes, labs, imaging, consultation notes   Lab Tests: -I ordered, reviewed, and interpreted labs.   The pertinent results include:   Labs Reviewed  CBC WITH DIFFERENTIAL/PLATELET - Abnormal; Notable for the following components:      Result Value   Hemoglobin 11.8 (*)    All other components within normal limits  URINALYSIS, ROUTINE W REFLEX MICROSCOPIC - Abnormal; Notable for the following components:   Specific Gravity, Urine >1.046 (*)    Ketones, ur 20 (*)    All other components within normal limits  COMPREHENSIVE METABOLIC PANEL  LIPASE, BLOOD  I-STAT BETA HCG BLOOD, ED ([facility], [facility], [facility] ONLY)      Imaging Studies ordered: I ordered imaging studies including CTAP, TVUS I independently visualized and interpreted imaging. I agree with the radiologist interpretation   Medicines ordered and prescription drug management: Meds ordered this encounter  Medications   iohexol (OMNIPAQUE) 300 MG/ML solution 100 mL   polyethylene glycol  (MIRALAX) 17 g packet    Sig: Take 17 g by mouth daily.    Dispense:  14 each    Refill:  0   naproxen (NAPROSYN) 375 MG tablet    Sig: Take 1 tablet (375 mg total) by mouth 2 (two) times daily.    Dispense:  20 tablet    Refill:  0    -I have reviewed the patients home medicines and have made adjustments as needed  Critical interventions none   Cardiac Monitoring: The patient was maintained on a cardiac monitor.  I personally viewed and interpreted the cardiac monitored which showed an underlying rhythm of: NSR  Social Determinants of Health:  Factors impacting patients care include: none   Reevaluation: After the interventions noted above, I reevaluated the patient and found that they have :improved  Co morbidities that complicate the patient evaluation History reviewed. No pertinent past medical history.    Dispostion: I considered admission for this patient, but with overall reassuring work-up she currently does not meet inpatient criteria for admission she is safe for discharge with outpatient follow-up.     Final Clinical  Impression(s) / ED Diagnoses Final diagnoses:  Abdominal pain, unspecified abdominal location  Cyst of ovary, unspecified laterality  Constipation, unspecified constipation type     @PCDICTATION @    Glendora ScoreKommor, Porsche Noguchi, MD 06/14/21 2336

## 2021-06-14 NOTE — ED Provider Triage Note (Signed)
Emergency Medicine Provider Triage Evaluation Note  Tiffany Salinas , a 26 y.o. female  was evaluated in triage.  Pt complains of right lower quadrant abdominal pain.  Started 2 weeks ago, its been constant.  Worse with ambulation, no nausea or vomiting.  Denies any hematuria or dysuria.  Sometimes it radiates to the back.  No previous abdominal surgeries..  Review of Systems  Per HPI  Physical Exam  BP 126/82 (BP Location: Right Arm)   Pulse 99   Temp 99.3 F (37.4 C) (Oral)   Resp 16   SpO2 100%  Gen:   Awake, no distress   Resp:  Normal effort  MSK:   Moves extremities without difficulty  Other:  Right lower quadrant tenderness.  No rebound, no CVA tenderness abdomen is soft  Medical Decision Making  Medically screening exam initiated at 2:36 PM.  Appropriate orders placed.  Ilithyia Titzer was informed that the remainder of the evaluation will be completed by another provider, this initial triage assessment does not replace that evaluation, and the importance of remaining in the ED until their evaluation is complete.     Theron Arista, PA-C 06/14/21 1437

## 2021-07-26 ENCOUNTER — Ambulatory Visit: Payer: 59 | Admitting: Family Medicine

## 2021-07-26 NOTE — Progress Notes (Deleted)
    SUBJECTIVE:   CHIEF COMPLAINT / HPI:   *** Burst ovarian cyst   Pap?  PERTINENT  PMH / PSH: ***  OBJECTIVE:   There were no vitals taken for this visit.  ***  ASSESSMENT/PLAN:   No problem-specific Assessment & Plan notes found for this encounter.     Lincoln Brigham, MD [facility]

## 2021-08-09 ENCOUNTER — Encounter: Payer: Self-pay | Admitting: Family Medicine

## 2021-08-09 ENCOUNTER — Other Ambulatory Visit: Payer: Self-pay

## 2021-08-09 ENCOUNTER — Ambulatory Visit (INDEPENDENT_AMBULATORY_CARE_PROVIDER_SITE_OTHER): Payer: 59 | Admitting: Family Medicine

## 2021-08-09 VITALS — BP 117/82 | HR 84 | Wt 204.7 lb

## 2021-08-09 DIAGNOSIS — N946 Dysmenorrhea, unspecified: Secondary | ICD-10-CM | POA: Insufficient documentation

## 2021-08-09 DIAGNOSIS — E7800 Pure hypercholesterolemia, unspecified: Secondary | ICD-10-CM | POA: Insufficient documentation

## 2021-08-09 MED ORDER — NORETHIN ACE-ETH ESTRAD-FE 1-20 MG-MCG PO TABS: 1.0000 | ORAL_TABLET | Freq: Every day | ORAL | 3 refills | Status: AC

## 2021-08-09 NOTE — Assessment & Plan Note (Signed)
Will begin low dose OCs to help with this and regulate her cycle. Risks and FH reviewed.

## 2021-08-09 NOTE — Progress Notes (Signed)
   Subjective:    Patient ID: Tiffany Salinas is a 26 y.o. female presenting with No chief complaint on file.  on 08/09/2021  HPI: F/u from the ED with ruptured ovarian cyst--with 2.4 cm follicle and small amount of free fluid noted on u/s. Reports pain on the right side for years, worse with her cycle Notes that she has had pain with worse with sneezing or coughing, Menarche at age 23. Cycles were q 3 months. Now seems to be more regular over the last 2 years but it is maybe q 6 weeks. Has significant dysmenorrhea that keeps her out of work.  Review of Systems  Constitutional:  Negative for chills and fever.  Respiratory:  Negative for shortness of breath.   Cardiovascular:  Negative for chest pain.  Gastrointestinal:  Negative for abdominal pain, nausea and vomiting.  Genitourinary:  Negative for dysuria.  Skin:  Negative for rash.      Objective:    BP 117/82   Pulse 84   Wt 204 lb 11.2 oz (92.9 kg)   BMI 39.98 kg/m  Physical Exam Exam conducted with a chaperone present.  Constitutional:      General: She is not in acute distress.    Appearance: She is well-developed.  HENT:     Head: Normocephalic and atraumatic.  Eyes:     General: No scleral icterus. Cardiovascular:     Rate and Rhythm: Normal rate.  Pulmonary:     Effort: Pulmonary effort is normal.  Abdominal:     Palpations: Abdomen is soft.  Musculoskeletal:     Cervical back: Neck supple.  Skin:    General: Skin is warm and dry.  Neurological:     Mental Status: She is alert and oriented to person, place, and time.         Assessment & Plan:   Problem List Items Addressed This Visit       Unprioritized   Dysmenorrhea - Primary    Will begin low dose OCs to help with this and regulate her cycle. Risks and FH reviewed.      Relevant Medications   norethindrone-ethinyl estradiol-FE (JUNEL FE 1/20) 1-20 MG-MCG tablet    Return in about 3 months (around 11/09/2021) for a follow-up.  Reva Bores,  MD 08/09/2021 9:02 AM

## 2021-08-09 NOTE — Progress Notes (Signed)
Patient in for ED f/u, states that pain from cysts were originally just on right side but now is experiencing on both sides. Report she was unaware she had cysts until ED visit but had been experiencing this pain for about a month. Reports pain as a constant whereas before it would come and go.   Appx 10 days ago she stopped bleeding after about a month of bleeding. Reports this was not heavy bleeding but more of a spotting. Reports that she has always had issues with irregular cycles and has not tried any medications in the past to help with regulation.      Wynona Canes, CMA

## 2021-10-03 ENCOUNTER — Other Ambulatory Visit (HOSPITAL_BASED_OUTPATIENT_CLINIC_OR_DEPARTMENT_OTHER): Payer: Self-pay

## 2021-10-03 MED ORDER — WEGOVY 0.25 MG/0.5ML ~~LOC~~ SOAJ
0.2500 mg | SUBCUTANEOUS | 0 refills | Status: AC
  Filled 2021-10-03 – 2021-10-24 (×2): qty 2, 28d supply, fill #0

## 2021-10-04 ENCOUNTER — Other Ambulatory Visit (HOSPITAL_BASED_OUTPATIENT_CLINIC_OR_DEPARTMENT_OTHER): Payer: Self-pay

## 2021-10-08 ENCOUNTER — Other Ambulatory Visit (HOSPITAL_BASED_OUTPATIENT_CLINIC_OR_DEPARTMENT_OTHER): Payer: Self-pay

## 2021-10-18 ENCOUNTER — Other Ambulatory Visit (HOSPITAL_BASED_OUTPATIENT_CLINIC_OR_DEPARTMENT_OTHER): Payer: Self-pay

## 2021-10-24 ENCOUNTER — Other Ambulatory Visit (HOSPITAL_BASED_OUTPATIENT_CLINIC_OR_DEPARTMENT_OTHER): Payer: Self-pay

## 2021-11-02 ENCOUNTER — Other Ambulatory Visit (HOSPITAL_BASED_OUTPATIENT_CLINIC_OR_DEPARTMENT_OTHER): Payer: Self-pay

## 2024-01-20 IMAGING — US US PELVIS COMPLETE
1 series · 14 of 25 positions shown · non-contrast
Comparison: None Available.

CLINICAL DATA: Pelvic pain.

EXAM:
TRANSABDOMINAL ULTRASOUND OF PELVIS
DOPPLER ULTRASOUND OF OVARIES
TECHNIQUE: Transabdominal ultrasound examination of the pelvis was performed
including evaluation of the uterus, ovaries, adnexal regions, and
pelvic cul-de-sac.
Color and duplex Doppler ultrasound was utilized to evaluate blood
flow to the ovaries.

[Series 1: us pelvic complete w transvaginal and torsion righ · 14 of 56 slices shown]
[im 1/56]
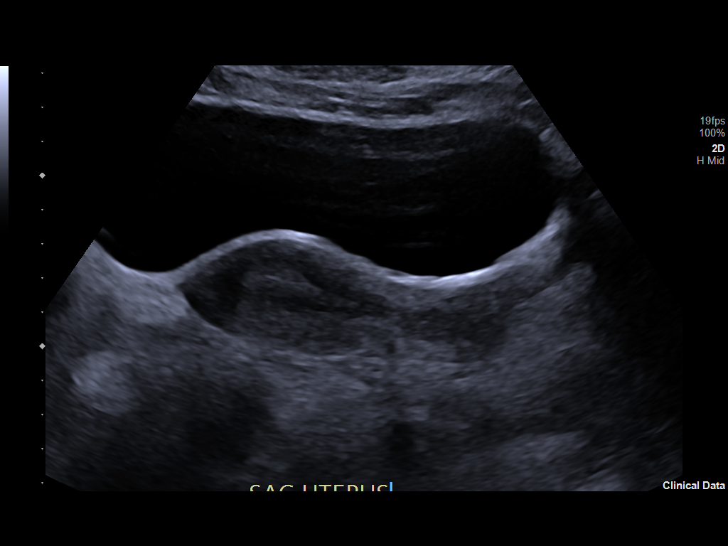
[im 5/56]
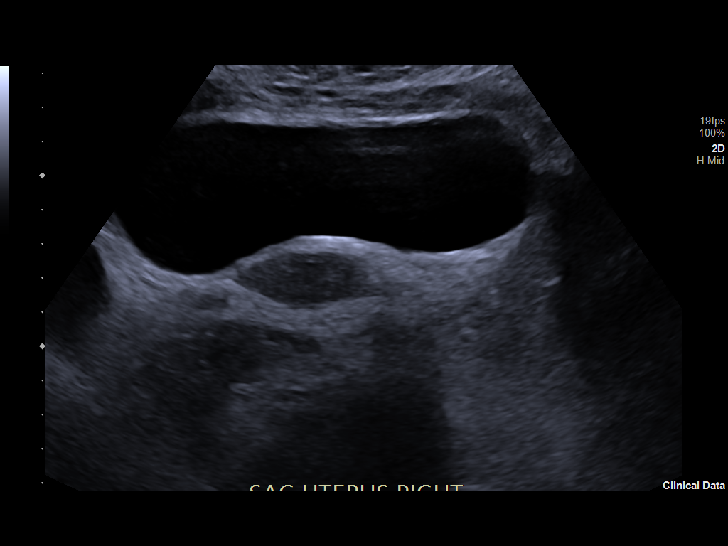
[im 10/56]
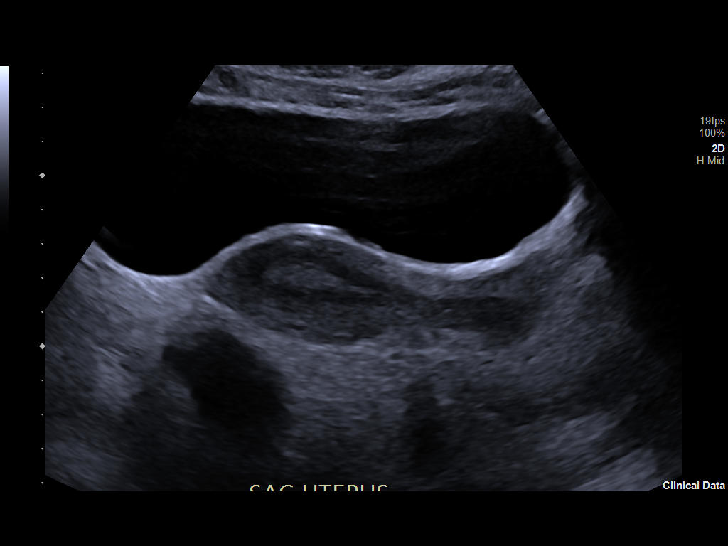
[im 14/56]
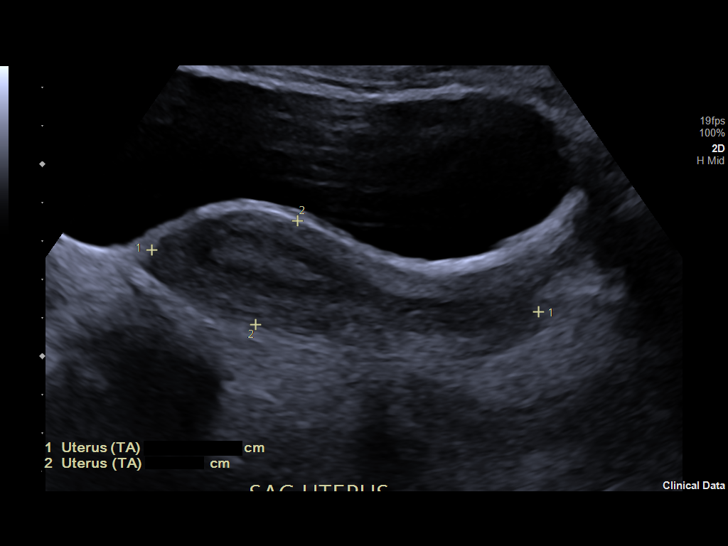
[im 19/56]
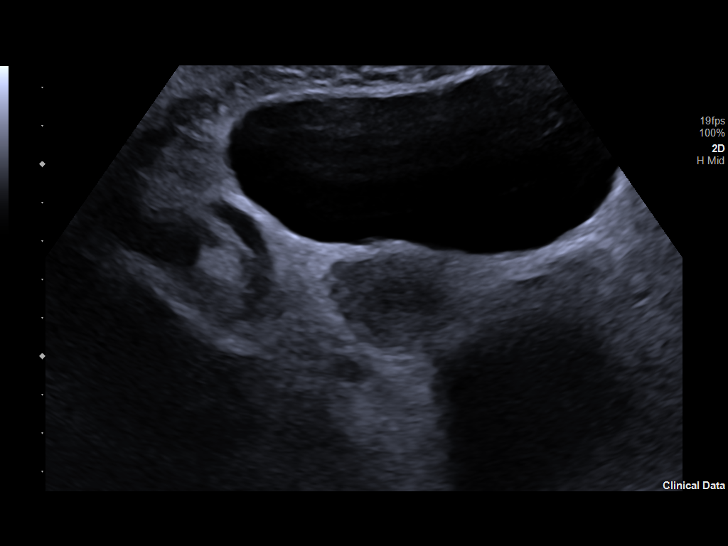
[im 21/56]
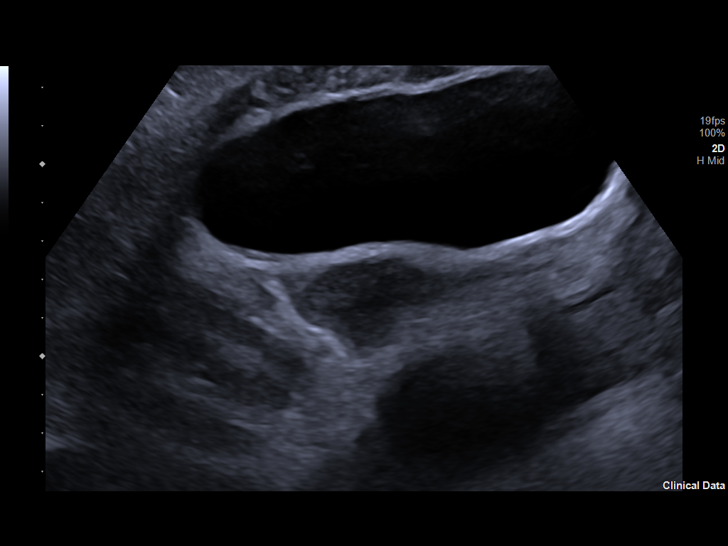
[im 26/56]
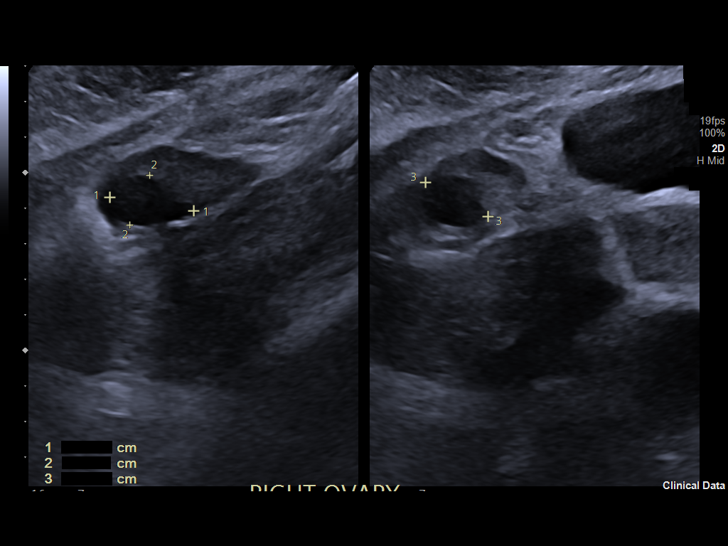
[im 30/56]
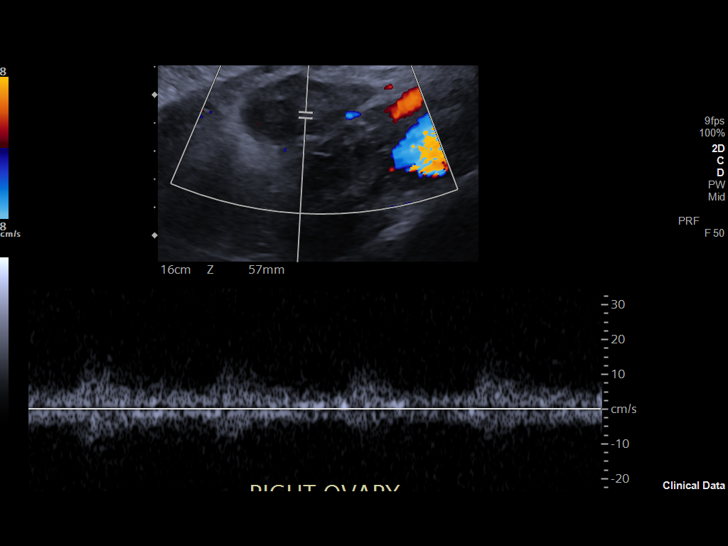
[im 35/56]
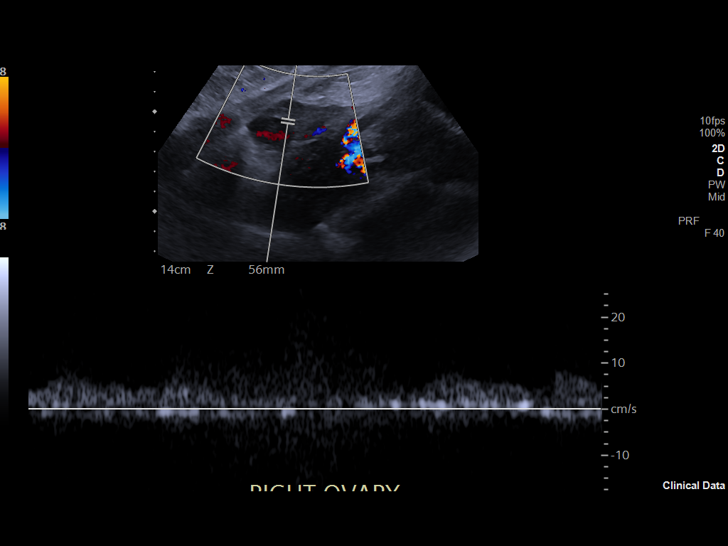
[im 37/56]
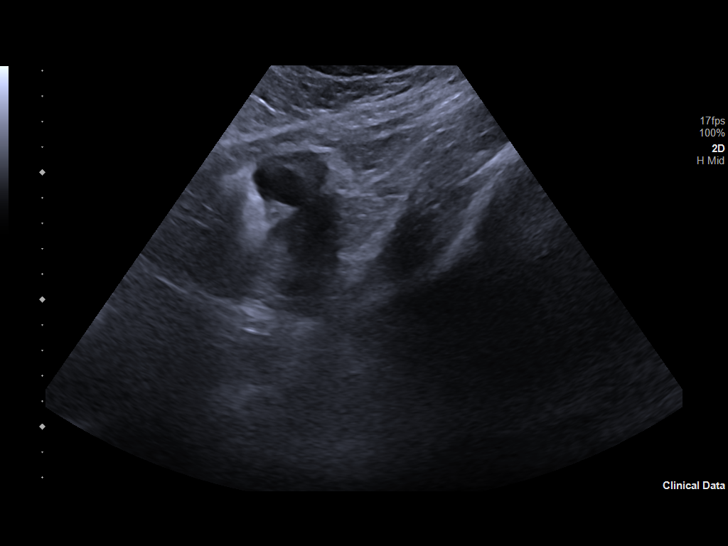
[im 42/56]
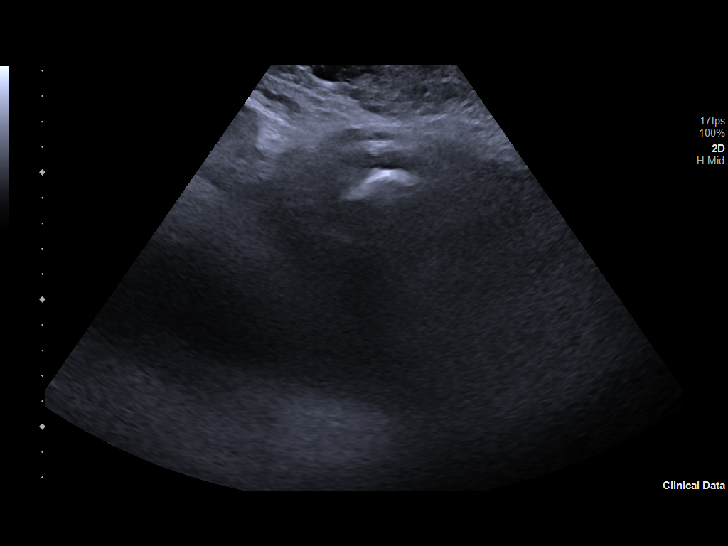
[im 46/56]
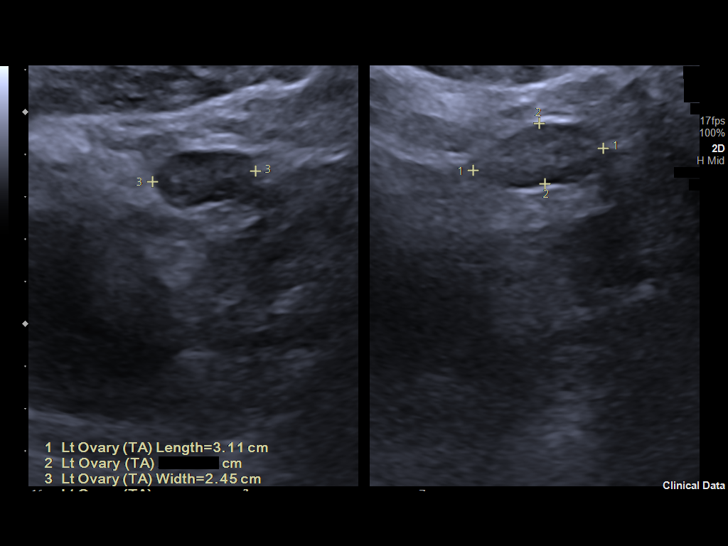
[im 51/56]
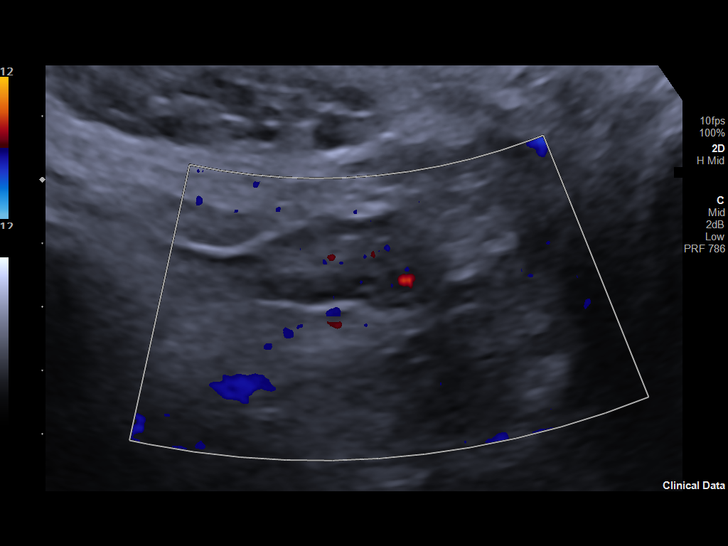
[im 56/56]
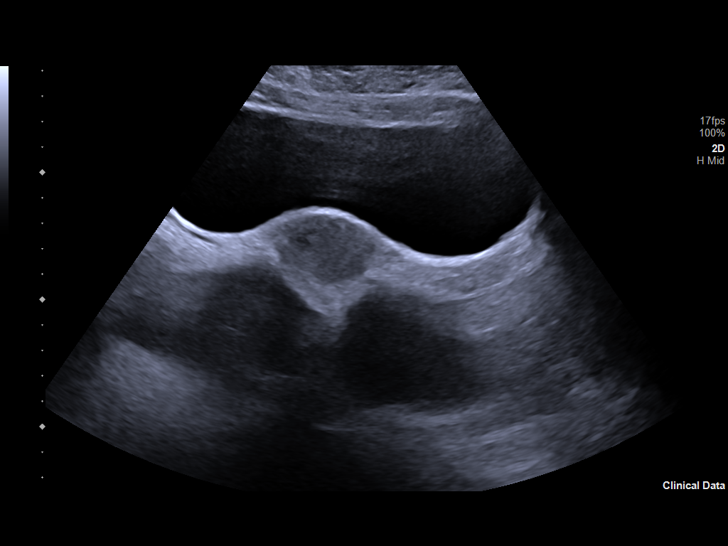

[14 of 25 positions shown; findings below may reference images not displayed]

FINDINGS: Uterus

Measurements: 10.2 x 2.9 x 4.4 cm = volume: 68 mL. No fibroids or
other mass visualized.

Endometrium

Thickness: 8.3 mm.  No focal abnormality visualized.

Right ovary

Measurements: 4.7 x 2.2 x 3.0 cm = volume: 16 mL. Right ovarian
simple cyst present measuring 2.4 x 1.5 x 2.0 cm.

Left ovary

Measurements: 3.1 x 1.4 x 2.5 cm = volume: 5.1 mL. Normal
appearance/no adnexal mass.

Pulsed Doppler evaluation demonstrates normal low-resistance
arterial and venous waveforms in both ovaries.

Other: Small amount of free fluid in the pelvis.
IMPRESSION: 1. 2.4 cm right ovarian follicle, normal finding. No follow-up
imaging is recommended.
Reference: Radiology [DATE]):[id number]
2. Small amount of free fluid in the pelvis.

## 2024-01-20 IMAGING — US US ART/VEN ABD/PELV/SCROTUM DOPPLER LTD
1 series · 14 of 25 positions shown · non-contrast
Comparison: None Available.

CLINICAL DATA: Pelvic pain.

EXAM:
TRANSABDOMINAL ULTRASOUND OF PELVIS
DOPPLER ULTRASOUND OF OVARIES
TECHNIQUE: Transabdominal ultrasound examination of the pelvis was performed
including evaluation of the uterus, ovaries, adnexal regions, and
pelvic cul-de-sac.
Color and duplex Doppler ultrasound was utilized to evaluate blood
flow to the ovaries.

[Series 1: us pelvic complete w transvaginal and torsion righ · 14 of 56 slices shown]
[im 1/56]
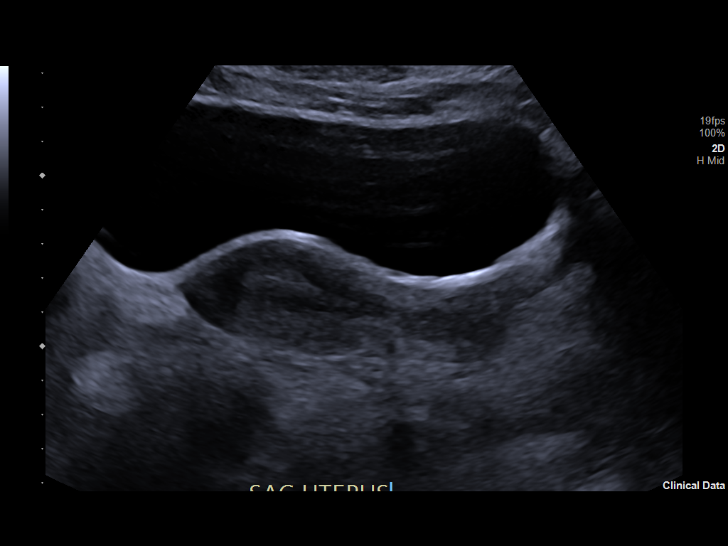
[im 5/56]
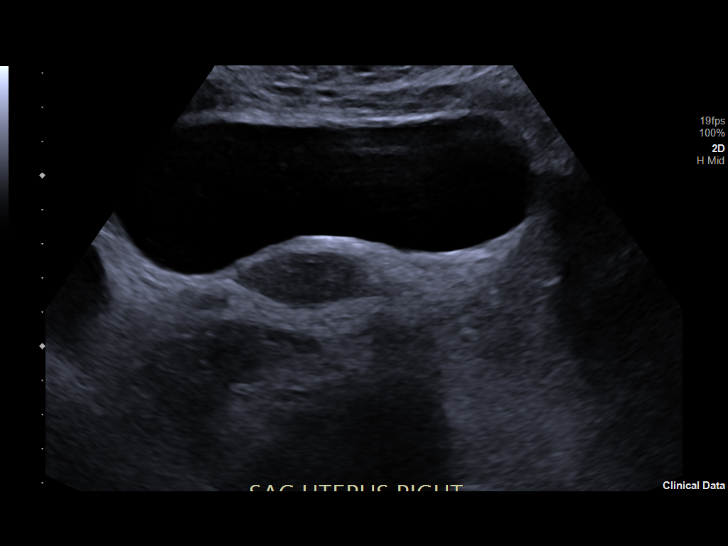
[im 10/56]
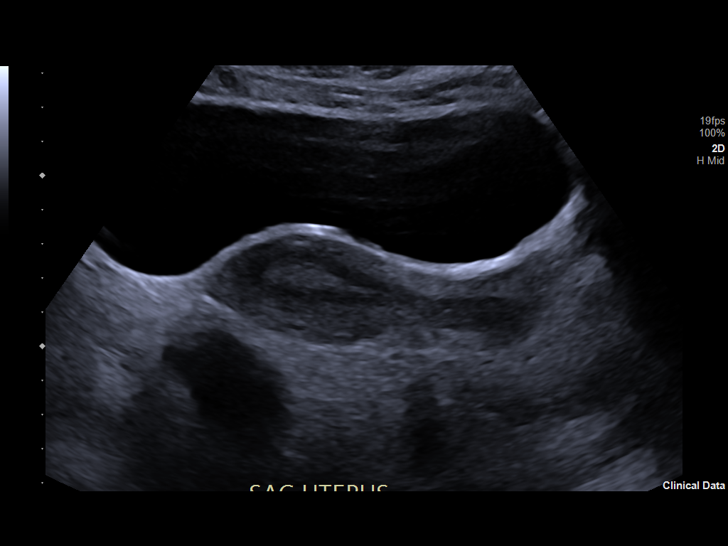
[im 14/56]
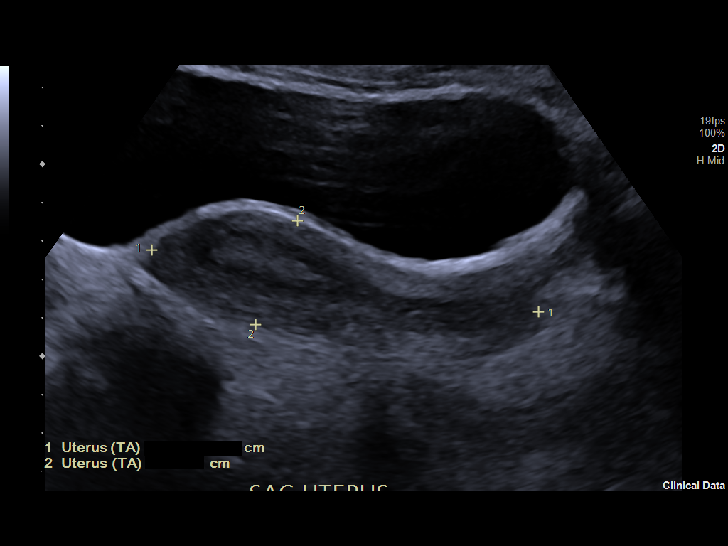
[im 19/56]
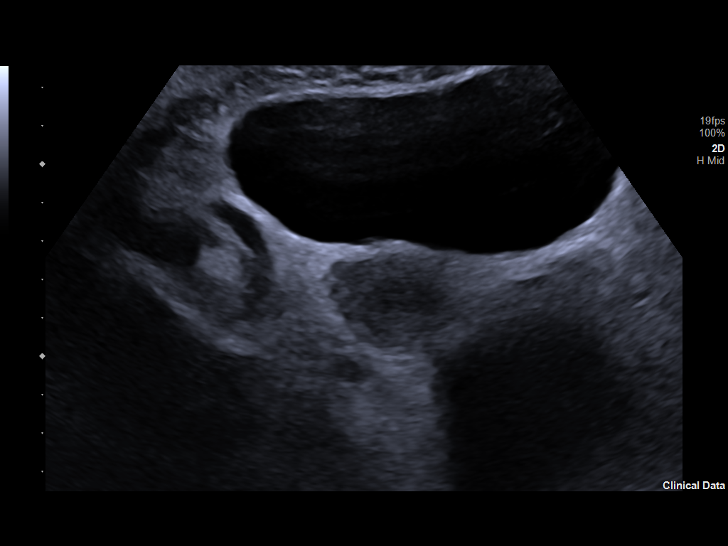
[im 21/56]
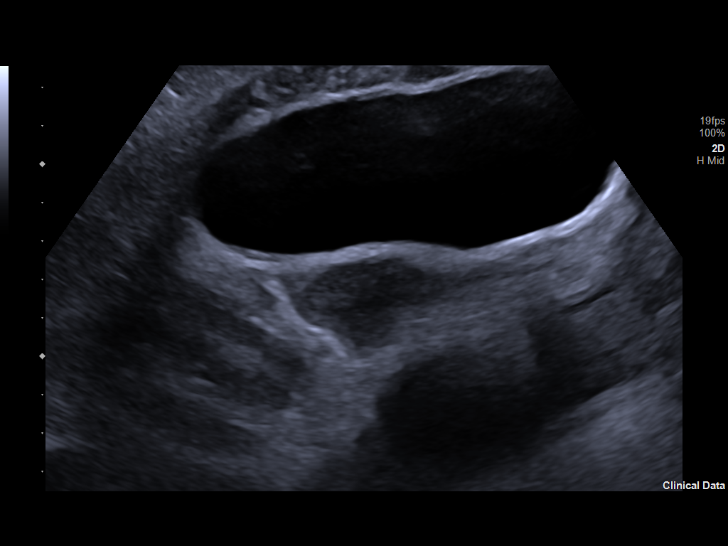
[im 26/56]
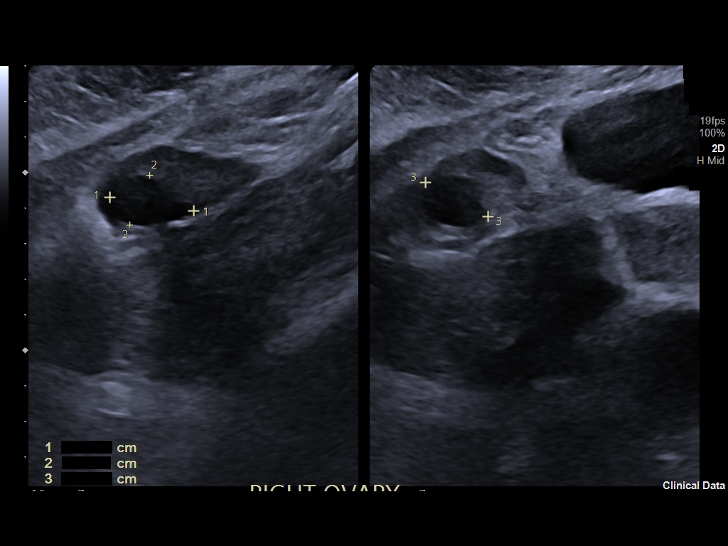
[im 30/56]
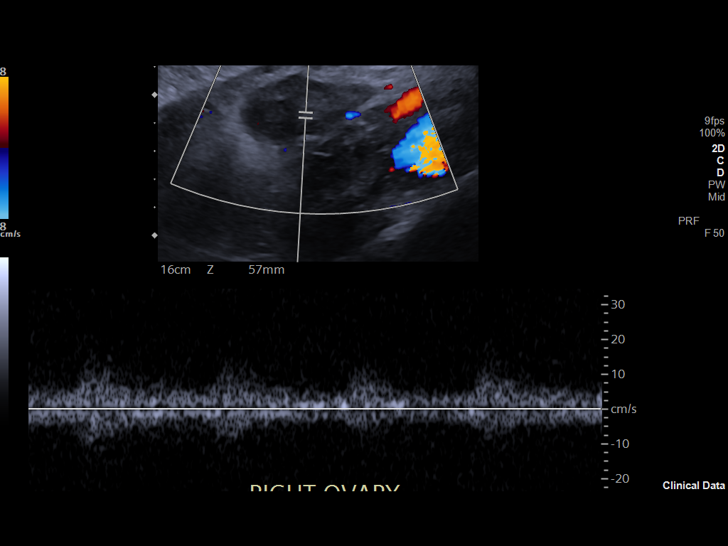
[im 35/56]
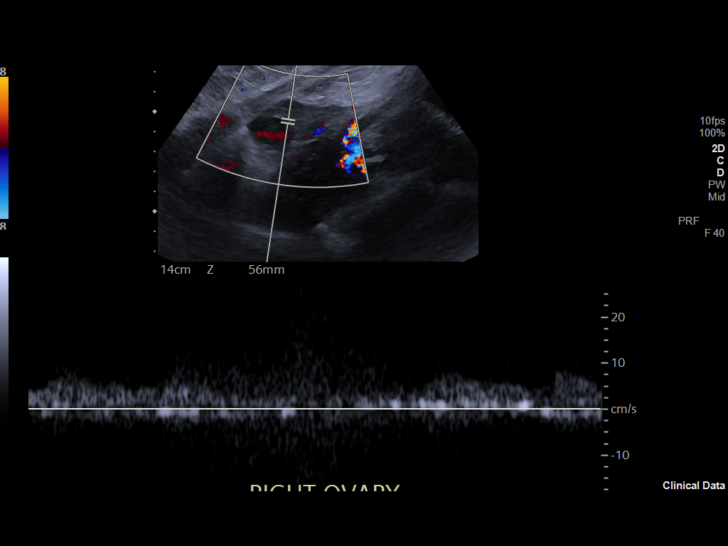
[im 37/56]
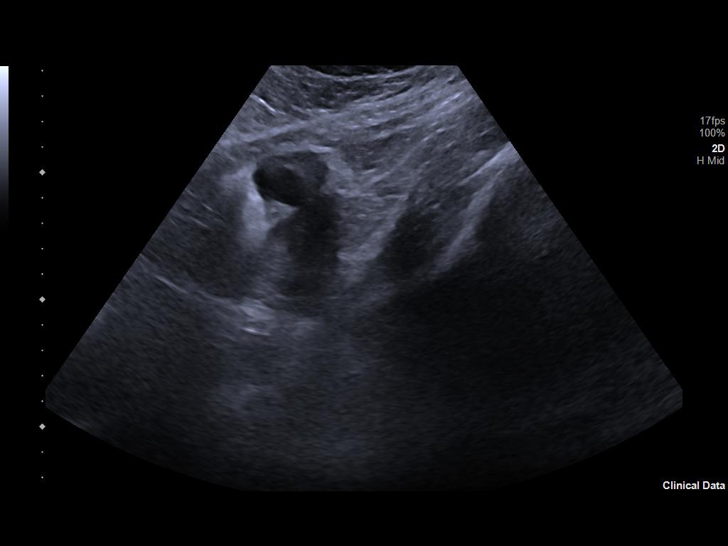
[im 42/56]
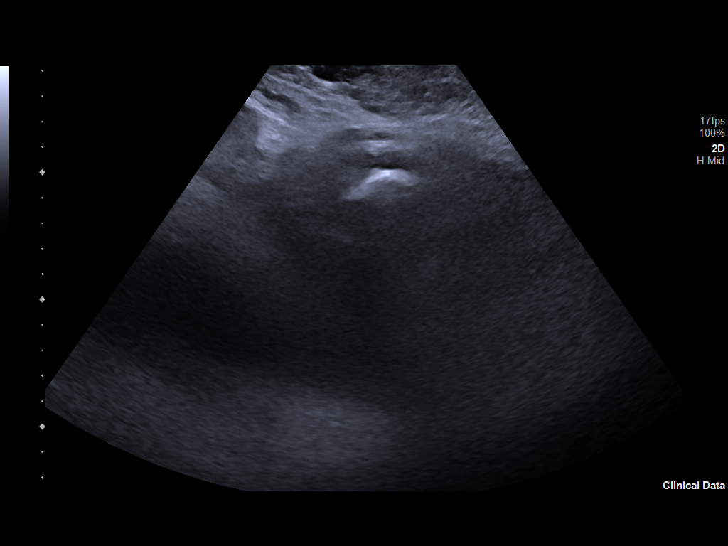
[im 46/56]
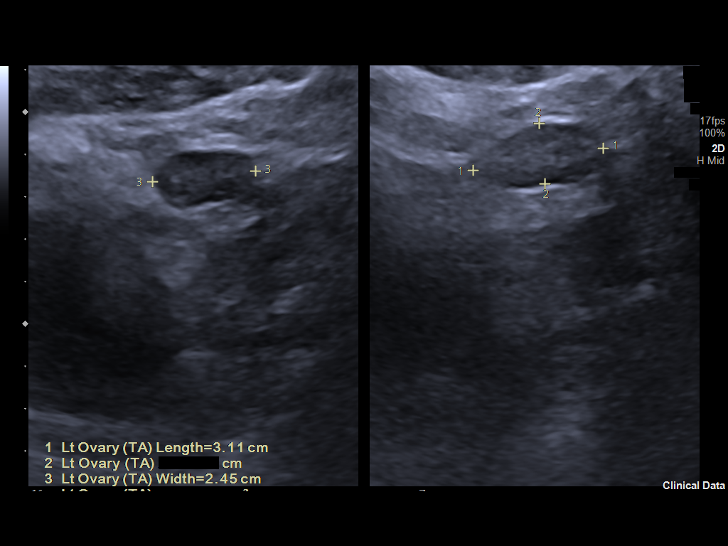
[im 51/56]
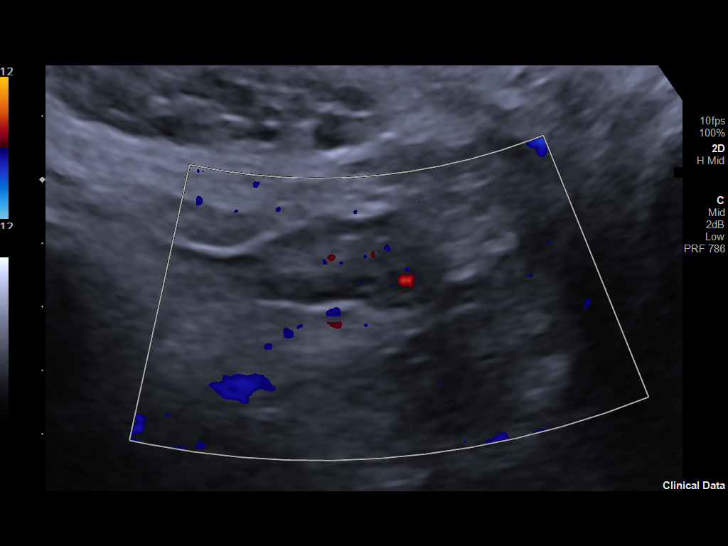
[im 56/56]
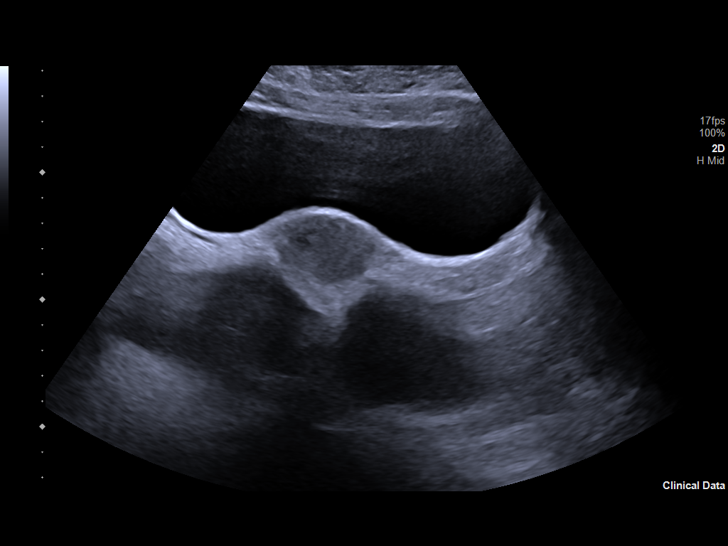

[14 of 25 positions shown; findings below may reference images not displayed]

FINDINGS: Uterus

Measurements: 10.2 x 2.9 x 4.4 cm = volume: 68 mL. No fibroids or
other mass visualized.

Endometrium

Thickness: 8.3 mm.  No focal abnormality visualized.

Right ovary

Measurements: 4.7 x 2.2 x 3.0 cm = volume: 16 mL. Right ovarian
simple cyst present measuring 2.4 x 1.5 x 2.0 cm.

Left ovary

Measurements: 3.1 x 1.4 x 2.5 cm = volume: 5.1 mL. Normal
appearance/no adnexal mass.

Pulsed Doppler evaluation demonstrates normal low-resistance
arterial and venous waveforms in both ovaries.

Other: Small amount of free fluid in the pelvis.
IMPRESSION: 1. 2.4 cm right ovarian follicle, normal finding. No follow-up
imaging is recommended.
Reference: Radiology [DATE]):[id number]
2. Small amount of free fluid in the pelvis.

## 2024-01-20 IMAGING — CT CT ABD-PELV W/ CM
2 of 4 series · 16 of 46 positions shown, 18 images · IV contrast (agent unspecified)
Comparison: None Available.

CLINICAL DATA: Pain right lower quadrant

EXAM:
CT ABDOMEN AND PELVIS WITH CONTRAST
TECHNIQUE: Multidetector CT imaging of the abdomen and pelvis was performed
using the standard protocol following bolus administration of
intravenous contrast.

[Series 3: a/p w/ 5mm · axial · 0.96mm/px · z∈[+874,+1314]mm · 13 of 98 slices shown, 15 images]
[im 5/98  soft-tissue]
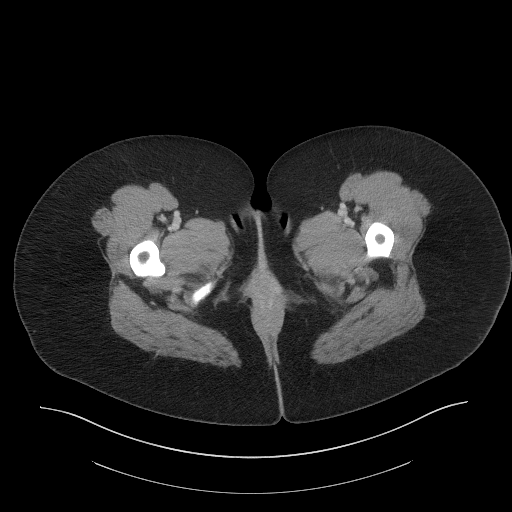
[im 5/98  bone]
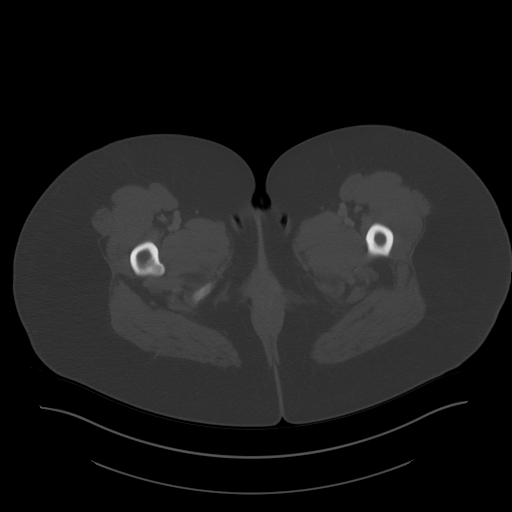
[im 13/98  soft-tissue]
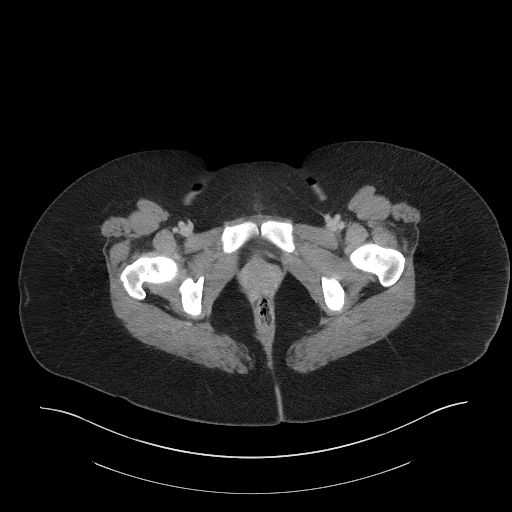
[im 21/98  soft-tissue]
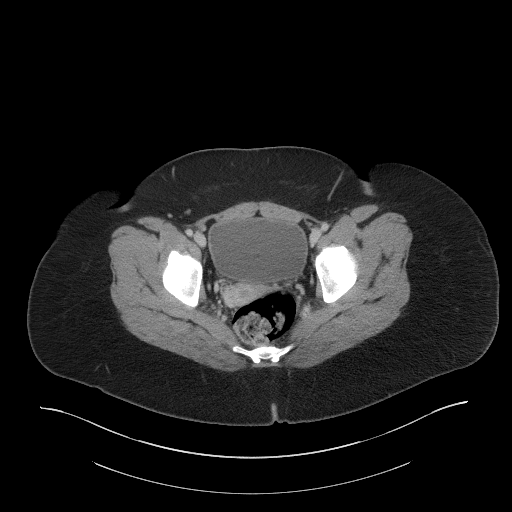
[im 29/98  soft-tissue]
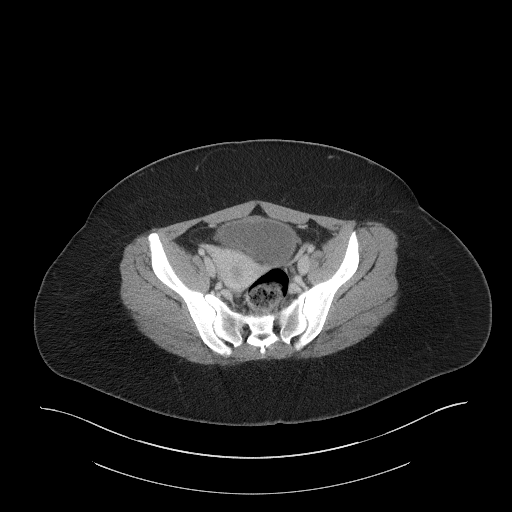
[im 33/98  soft-tissue]
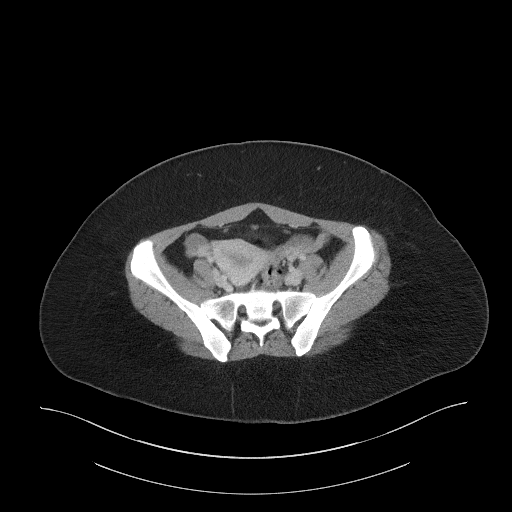
[im 41/98  soft-tissue]
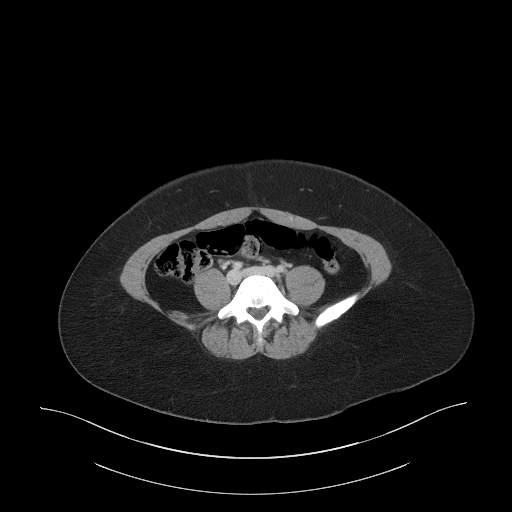
[im 49/98  soft-tissue]
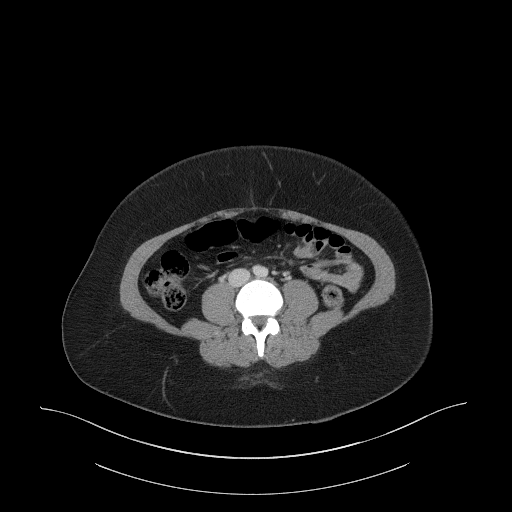
[im 57/98  soft-tissue]
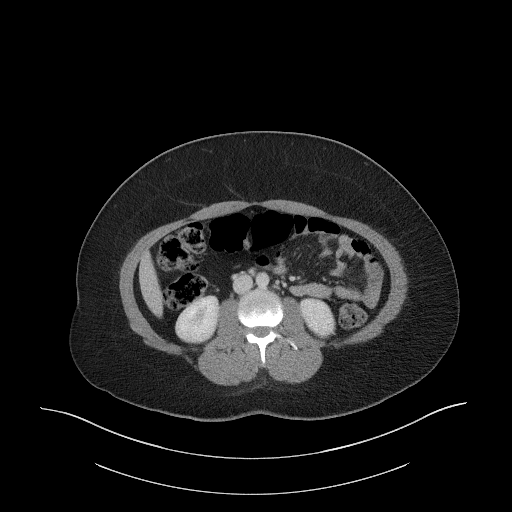
[im 65/98  soft-tissue]
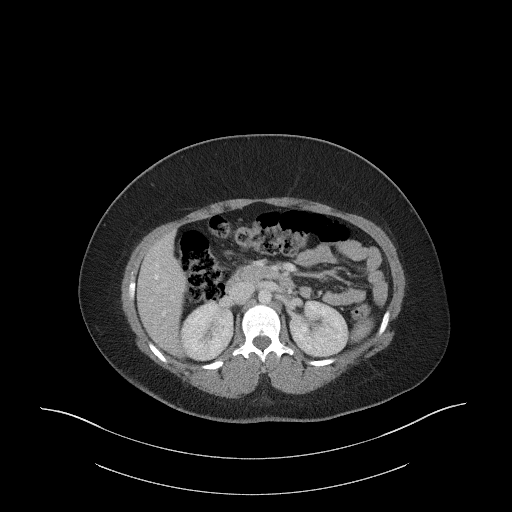
[im 65/98  bone]
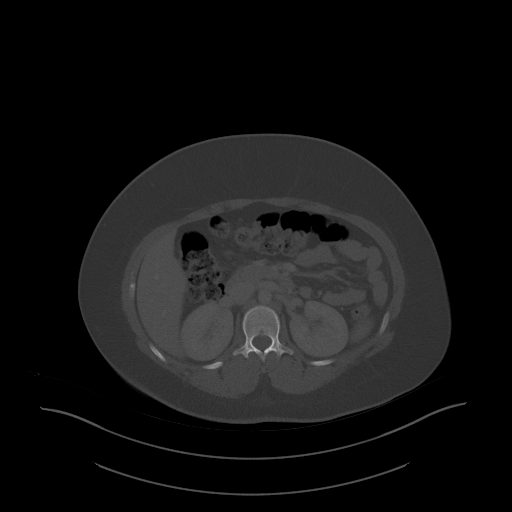
[im 69/98  soft-tissue]
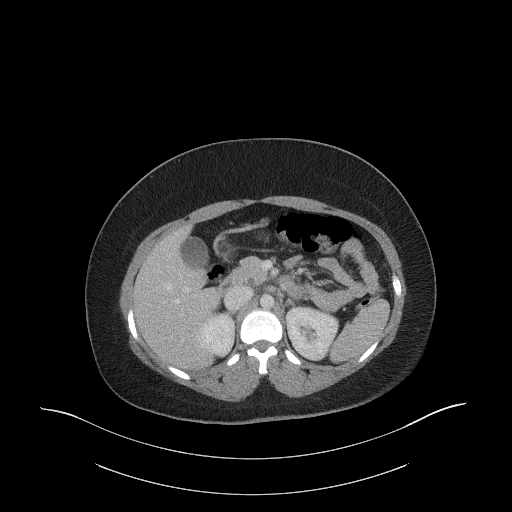
[im 77/98  soft-tissue]
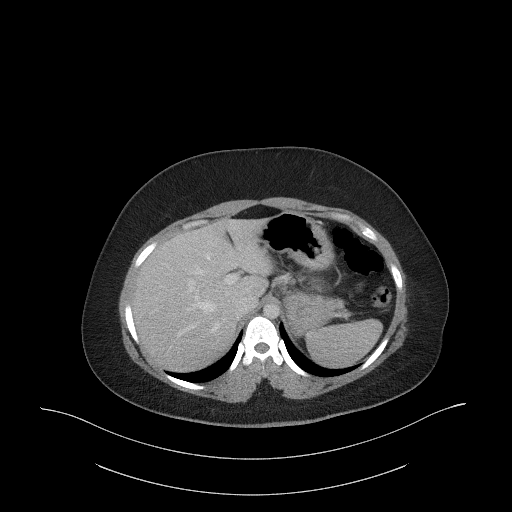
[im 85/98  soft-tissue]
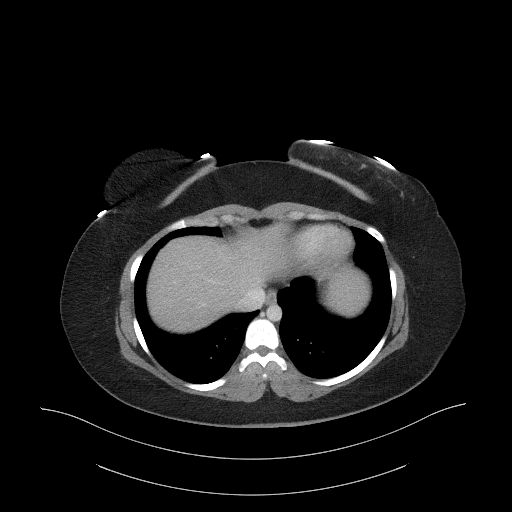
[im 93/98  soft-tissue]
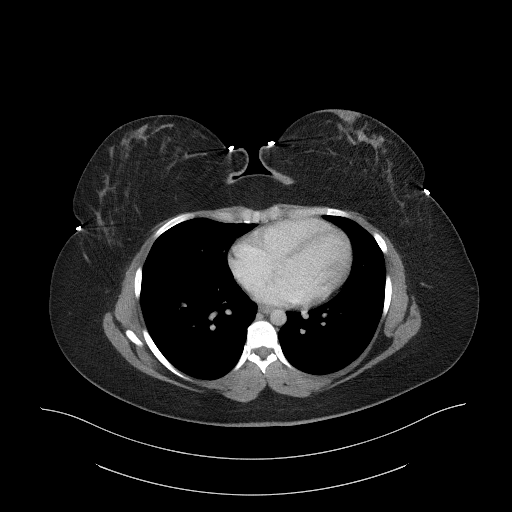

[Series 6: a/p w/ cor · coronal · 0.93mm/px · 3 of 151 slices shown]
[im 51/151  soft-tissue]
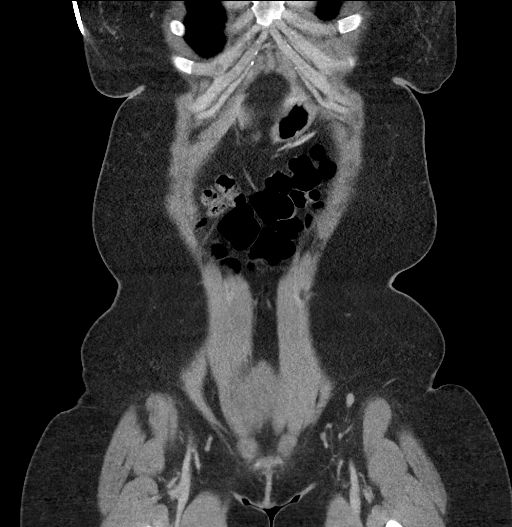
[im 67/151  soft-tissue]
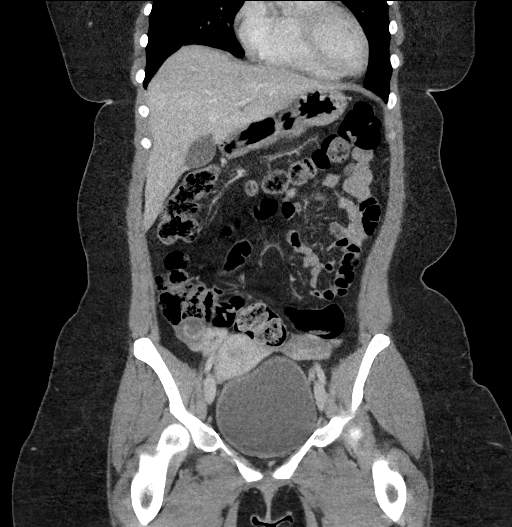
[im 84/151  soft-tissue]
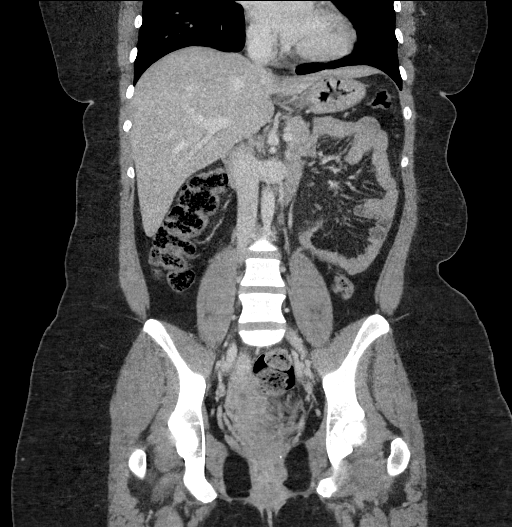

[16 of 46 positions shown; findings below may reference images not displayed]

RADIATION DOSE REDUCTION: This exam was performed according to the
departmental dose-optimization program which includes automated
exposure control, adjustment of the mA and/or kV according to
patient size and/or use of iterative reconstruction technique.

CONTRAST:  100mL OMNIPAQUE IOHEXOL 300 MG/ML  SOLN
FINDINGS: Lower chest: Unremarkable.

Hepatobiliary: Liver measures 18.5 cm in length. No focal
abnormality is seen. There is no dilation of bile ducts. Gallbladder
is unremarkable.

Pancreas: No focal abnormality is seen.

Spleen: Unremarkable.

Adrenals/Urinary Tract: Adrenals are unremarkable. There is no
hydronephrosis. There are no renal or ureteral stones. Urinary
bladder is unremarkable.

Stomach/Bowel: Stomach is unremarkable. Small bowel loops are not
dilated. Appendix is not dilated. There is no wall thickening in the
colon. There is no pericolic stranding.

Vascular/Lymphatic: Unremarkable.

Reproductive: Uterus is slightly to the right of midline. There is
3.3 cm smooth marginated low-density structure in the right adnexa.
There is 1.6 cm low-density structure with crenated enhancing
margins in the right adnexa. There is trace amount of free fluid in
the cul-de-sac.

Other: There is no pneumoperitoneum.

Musculoskeletal: Unremarkable.
IMPRESSION: There is no evidence of intestinal obstruction or pneumoperitoneum.
Appendix is not dilated. There is no hydronephrosis.

There is 3.3 cm cyst in the right adnexa, possibly functional right
ovarian cyst. There is 1.6 cm low-density structure with crenated
enhancing margins in the right adnexa suggesting recently ruptured
ovarian cyst or follicle. Trace amount of free fluid is seen in the
pelvis. If clinically warranted pelvic sonogram may be considered
for further evaluation.
